# Patient Record
Sex: Male | Born: 1960 | Race: White | Hispanic: No | Marital: Married | Smoking: Current every day smoker
Health system: Southern US, Community
[De-identification: ages and names within clinical notes are randomized; demographics above are authoritative.]

## PROBLEM LIST (undated history)

## (undated) DIAGNOSIS — K529 Noninfective gastroenteritis and colitis, unspecified: Secondary | ICD-10-CM

## (undated) DIAGNOSIS — I10 Essential (primary) hypertension: Secondary | ICD-10-CM

## (undated) DIAGNOSIS — M1990 Unspecified osteoarthritis, unspecified site: Secondary | ICD-10-CM

## (undated) DIAGNOSIS — K219 Gastro-esophageal reflux disease without esophagitis: Secondary | ICD-10-CM

## (undated) HISTORY — PX: SURGERY SCROTAL / TESTICULAR: SUR1316

## (undated) HISTORY — PX: KNEE SURGERY: SHX244

---

## 2001-03-20 ENCOUNTER — Encounter: Payer: Self-pay | Admitting: Emergency Medicine

## 2001-03-20 ENCOUNTER — Emergency Department (HOSPITAL_COMMUNITY): Admission: EM | Admit: 2001-03-20 | Discharge: 2001-03-20 | Payer: Self-pay | Admitting: Emergency Medicine

## 2002-07-14 ENCOUNTER — Emergency Department (HOSPITAL_COMMUNITY): Admission: EM | Admit: 2002-07-14 | Discharge: 2002-07-14 | Payer: Self-pay | Admitting: Emergency Medicine

## 2002-07-14 ENCOUNTER — Encounter: Payer: Self-pay | Admitting: Emergency Medicine

## 2002-08-30 ENCOUNTER — Emergency Department (HOSPITAL_COMMUNITY): Admission: EM | Admit: 2002-08-30 | Discharge: 2002-08-30 | Payer: Self-pay | Admitting: Emergency Medicine

## 2004-03-24 ENCOUNTER — Ambulatory Visit (HOSPITAL_COMMUNITY): Admission: RE | Admit: 2004-03-24 | Discharge: 2004-03-24 | Payer: Self-pay | Admitting: General Surgery

## 2004-05-08 ENCOUNTER — Emergency Department (HOSPITAL_COMMUNITY): Admission: EM | Admit: 2004-05-08 | Discharge: 2004-05-08 | Payer: Self-pay | Admitting: Emergency Medicine

## 2004-06-12 ENCOUNTER — Emergency Department (HOSPITAL_COMMUNITY): Admission: EM | Admit: 2004-06-12 | Discharge: 2004-06-12 | Payer: Self-pay | Admitting: Emergency Medicine

## 2004-06-14 ENCOUNTER — Ambulatory Visit: Payer: Self-pay | Admitting: Urgent Care

## 2004-06-28 ENCOUNTER — Ambulatory Visit (HOSPITAL_COMMUNITY): Admission: RE | Admit: 2004-06-28 | Discharge: 2004-06-28 | Payer: Self-pay | Admitting: Internal Medicine

## 2004-06-28 ENCOUNTER — Ambulatory Visit: Payer: Self-pay | Admitting: Internal Medicine

## 2004-07-22 ENCOUNTER — Inpatient Hospital Stay (HOSPITAL_COMMUNITY): Admission: EM | Admit: 2004-07-22 | Discharge: 2004-07-25 | Payer: Self-pay | Admitting: Emergency Medicine

## 2004-07-22 ENCOUNTER — Ambulatory Visit: Payer: Self-pay | Admitting: Internal Medicine

## 2004-08-10 ENCOUNTER — Ambulatory Visit: Payer: Self-pay | Admitting: Internal Medicine

## 2004-08-18 ENCOUNTER — Emergency Department (HOSPITAL_COMMUNITY): Admission: EM | Admit: 2004-08-18 | Discharge: 2004-08-18 | Payer: Self-pay | Admitting: Emergency Medicine

## 2005-02-17 ENCOUNTER — Emergency Department (HOSPITAL_COMMUNITY): Admission: EM | Admit: 2005-02-17 | Discharge: 2005-02-17 | Payer: Self-pay | Admitting: *Deleted

## 2005-03-05 ENCOUNTER — Emergency Department (HOSPITAL_COMMUNITY): Admission: EM | Admit: 2005-03-05 | Discharge: 2005-03-05 | Payer: Self-pay | Admitting: Emergency Medicine

## 2005-03-19 ENCOUNTER — Emergency Department (HOSPITAL_COMMUNITY): Admission: EM | Admit: 2005-03-19 | Discharge: 2005-03-19 | Payer: Self-pay | Admitting: *Deleted

## 2005-05-03 ENCOUNTER — Ambulatory Visit: Payer: Self-pay | Admitting: Internal Medicine

## 2005-05-12 ENCOUNTER — Emergency Department (HOSPITAL_COMMUNITY): Admission: EM | Admit: 2005-05-12 | Discharge: 2005-05-12 | Payer: Self-pay | Admitting: Emergency Medicine

## 2005-08-31 ENCOUNTER — Ambulatory Visit (HOSPITAL_COMMUNITY): Admission: RE | Admit: 2005-08-31 | Discharge: 2005-08-31 | Payer: Self-pay | Admitting: General Surgery

## 2005-09-27 ENCOUNTER — Ambulatory Visit: Payer: Self-pay | Admitting: Orthopedic Surgery

## 2005-10-02 ENCOUNTER — Ambulatory Visit (HOSPITAL_COMMUNITY): Admission: RE | Admit: 2005-10-02 | Discharge: 2005-10-02 | Payer: Self-pay | Admitting: Orthopedic Surgery

## 2005-10-02 ENCOUNTER — Ambulatory Visit: Payer: Self-pay | Admitting: Orthopedic Surgery

## 2005-10-05 ENCOUNTER — Encounter (HOSPITAL_COMMUNITY): Admission: RE | Admit: 2005-10-05 | Discharge: 2005-11-04 | Payer: Self-pay | Admitting: Orthopedic Surgery

## 2005-10-08 ENCOUNTER — Ambulatory Visit: Payer: Self-pay | Admitting: Orthopedic Surgery

## 2005-10-31 ENCOUNTER — Ambulatory Visit: Payer: Self-pay | Admitting: Orthopedic Surgery

## 2005-11-28 ENCOUNTER — Ambulatory Visit: Payer: Self-pay | Admitting: Orthopedic Surgery

## 2006-01-14 ENCOUNTER — Ambulatory Visit: Payer: Self-pay | Admitting: Orthopedic Surgery

## 2006-02-13 ENCOUNTER — Ambulatory Visit: Payer: Self-pay | Admitting: Orthopedic Surgery

## 2006-03-08 ENCOUNTER — Ambulatory Visit (HOSPITAL_COMMUNITY): Admission: RE | Admit: 2006-03-08 | Discharge: 2006-03-08 | Payer: Self-pay | Admitting: Family Medicine

## 2006-03-08 ENCOUNTER — Ambulatory Visit (HOSPITAL_COMMUNITY): Admission: RE | Admit: 2006-03-08 | Discharge: 2006-03-08 | Payer: Self-pay | Admitting: Orthopedic Surgery

## 2006-08-01 ENCOUNTER — Ambulatory Visit: Payer: Self-pay | Admitting: Orthopedic Surgery

## 2006-08-29 ENCOUNTER — Ambulatory Visit: Payer: Self-pay | Admitting: Orthopedic Surgery

## 2006-10-17 ENCOUNTER — Ambulatory Visit (HOSPITAL_COMMUNITY): Admission: RE | Admit: 2006-10-17 | Discharge: 2006-10-17 | Payer: Self-pay | Admitting: Family Medicine

## 2006-10-31 ENCOUNTER — Ambulatory Visit (HOSPITAL_COMMUNITY): Admission: RE | Admit: 2006-10-31 | Discharge: 2006-10-31 | Payer: Self-pay | Admitting: Family Medicine

## 2007-01-09 ENCOUNTER — Emergency Department (HOSPITAL_COMMUNITY): Admission: EM | Admit: 2007-01-09 | Discharge: 2007-01-09 | Payer: Self-pay | Admitting: Emergency Medicine

## 2007-01-12 ENCOUNTER — Emergency Department (HOSPITAL_COMMUNITY): Admission: EM | Admit: 2007-01-12 | Discharge: 2007-01-12 | Payer: Self-pay | Admitting: Emergency Medicine

## 2007-01-16 ENCOUNTER — Emergency Department (HOSPITAL_COMMUNITY): Admission: EM | Admit: 2007-01-16 | Discharge: 2007-01-16 | Payer: Self-pay | Admitting: Emergency Medicine

## 2007-03-03 ENCOUNTER — Emergency Department (HOSPITAL_COMMUNITY): Admission: EM | Admit: 2007-03-03 | Discharge: 2007-03-03 | Payer: Self-pay | Admitting: Emergency Medicine

## 2007-07-04 ENCOUNTER — Emergency Department (HOSPITAL_COMMUNITY): Admission: EM | Admit: 2007-07-04 | Discharge: 2007-07-04 | Payer: Self-pay | Admitting: Emergency Medicine

## 2007-08-22 ENCOUNTER — Ambulatory Visit (HOSPITAL_COMMUNITY): Admission: RE | Admit: 2007-08-22 | Discharge: 2007-08-22 | Payer: Self-pay | Admitting: Family Medicine

## 2007-08-26 ENCOUNTER — Encounter (HOSPITAL_COMMUNITY): Admission: RE | Admit: 2007-08-26 | Discharge: 2007-09-25 | Payer: Self-pay | Admitting: Family Medicine

## 2007-12-14 ENCOUNTER — Emergency Department (HOSPITAL_COMMUNITY): Admission: EM | Admit: 2007-12-14 | Discharge: 2007-12-14 | Payer: Self-pay | Admitting: Emergency Medicine

## 2008-01-01 IMAGING — CT CT CHEST W/ CM
2 of 3 series · 15 of 36 positions shown, 18 images · IV contrast (Omnipaque 300)
Comparison: Chest CTA 03/19/05.

CLINICAL DATA: Dyspnea and cough.  Chronic pleural effusion.  Weight loss.
CHEST CT WITH CONTRAST ? 10/31/06:
TECHNIQUE: Multidetector CT imaging of the chest was performed following the standard protocol during bolus administration of intravenous contrast.   
Contrast:  80 cc Omnipaque 300 IV.

[Series 2: chestroutine 5.0 b40f · axial · 0.65mm/px · z∈[-372,-72]mm · 12 of 72 slices shown, 15 images]
[im 6/72  mediastinal]
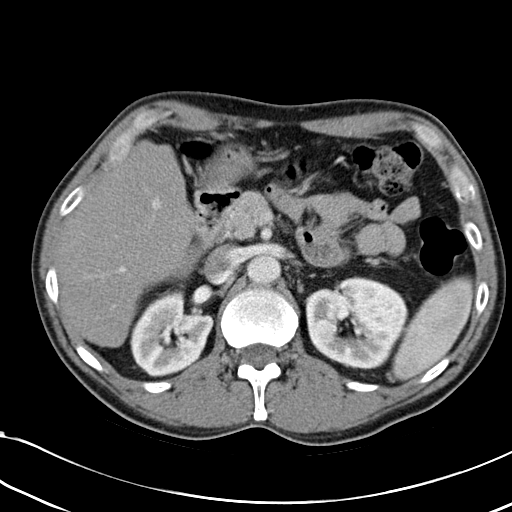
[im 6/72  lung]
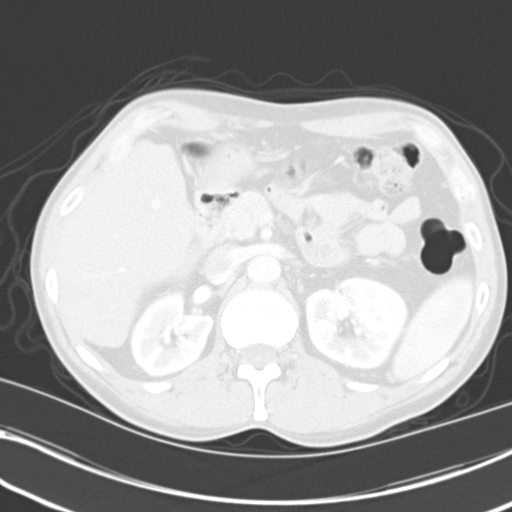
[im 11/72  lung]
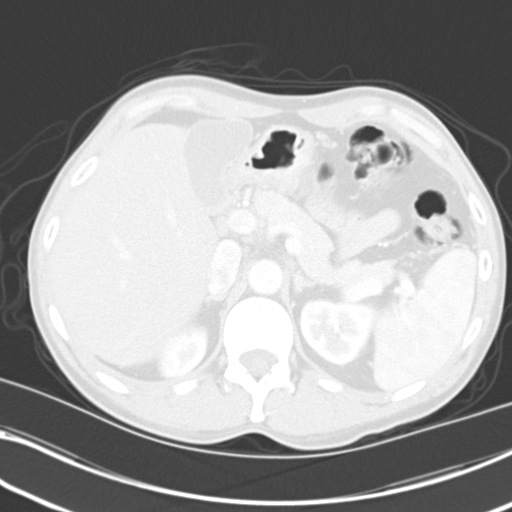
[im 16/72  lung]
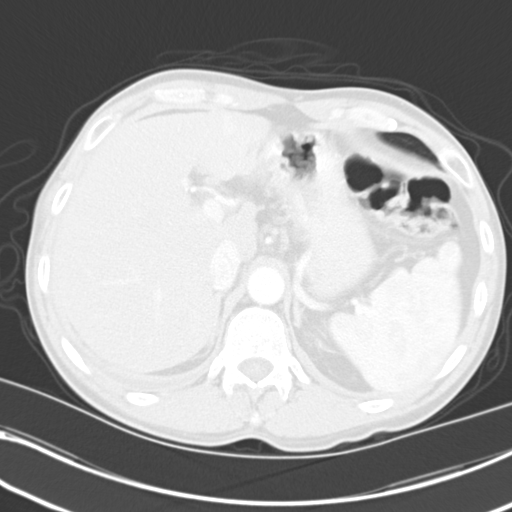
[im 22/72  lung]
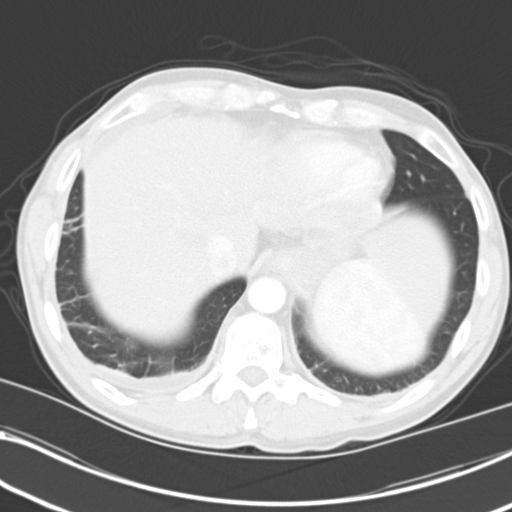
[im 27/72  mediastinal]
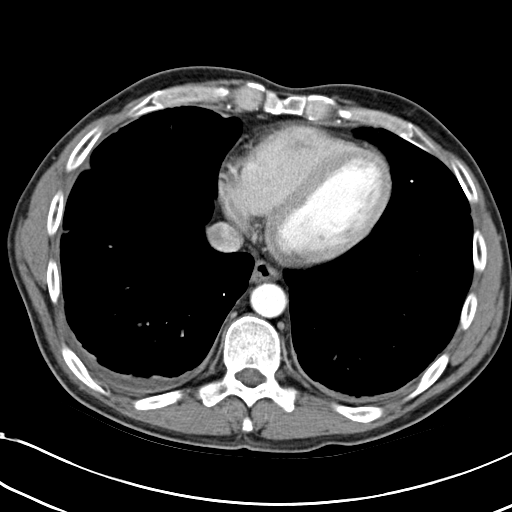
[im 27/72  lung]
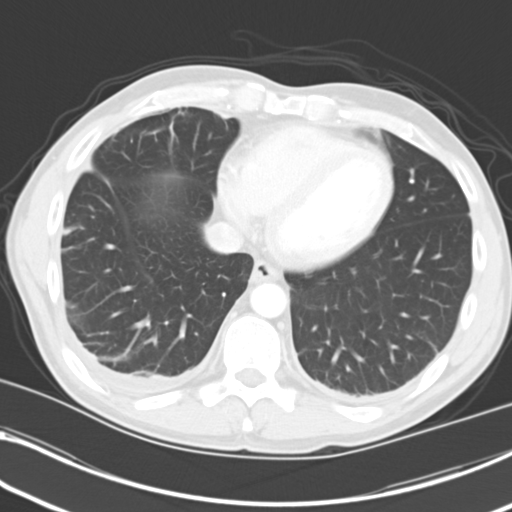
[im 32/72  lung]
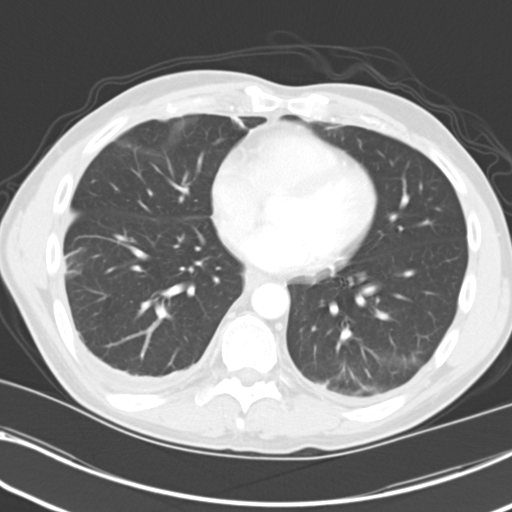
[im 40/72  lung]
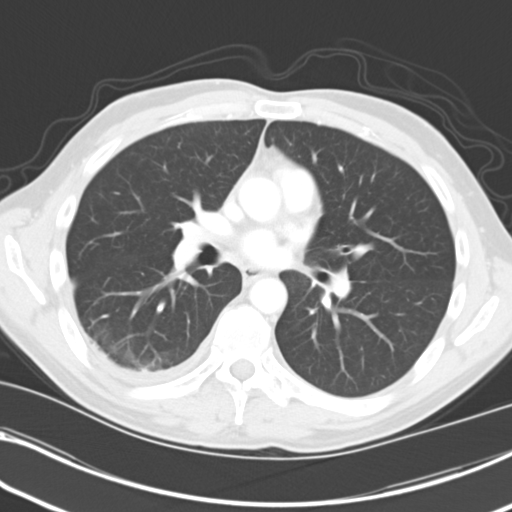
[im 45/72  lung]
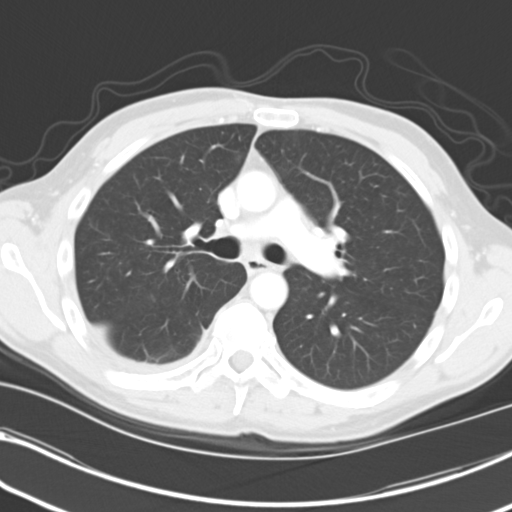
[im 50/72  mediastinal]
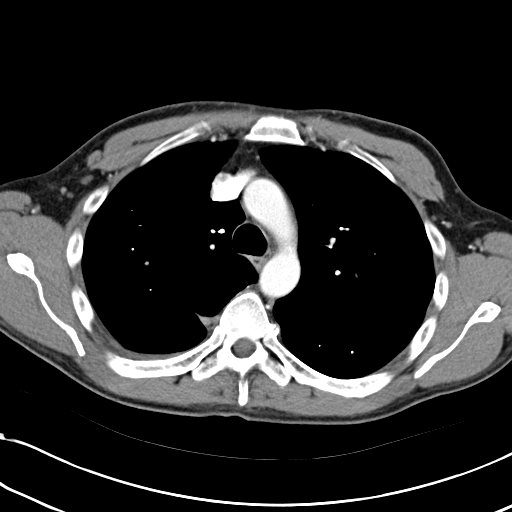
[im 50/72  lung]
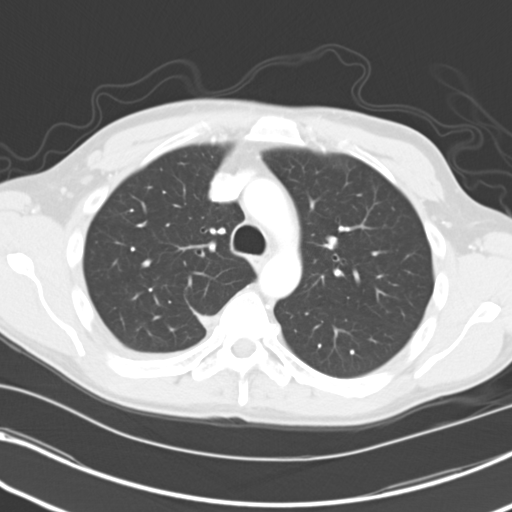
[im 56/72  lung]
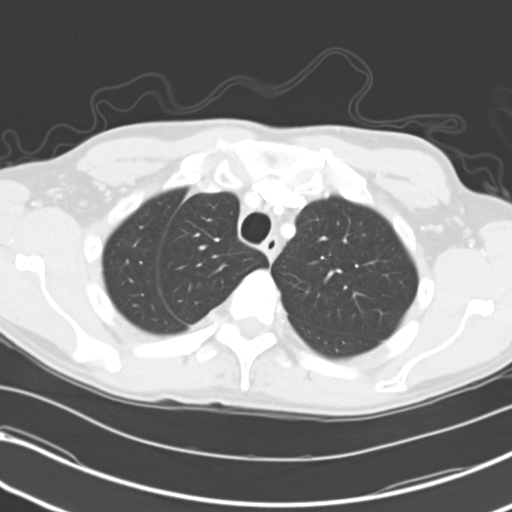
[im 61/72  lung]
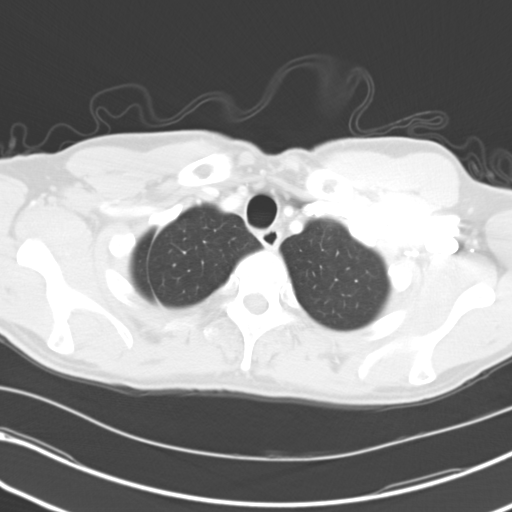
[im 66/72  lung]
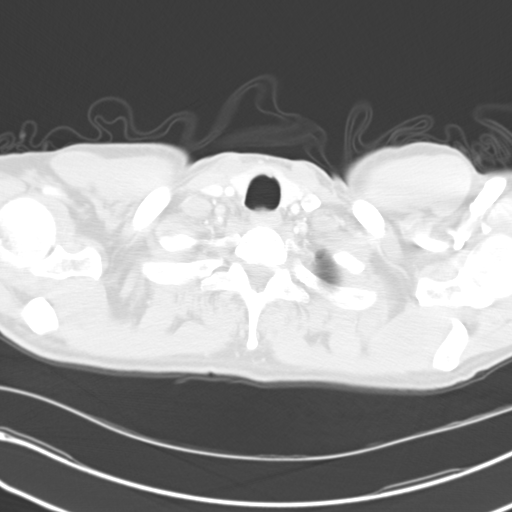

[Series 4: mpr coronal chest · coronal · 0.61mm/px · 3 of 77 slices shown]
[im 16/77  lung]
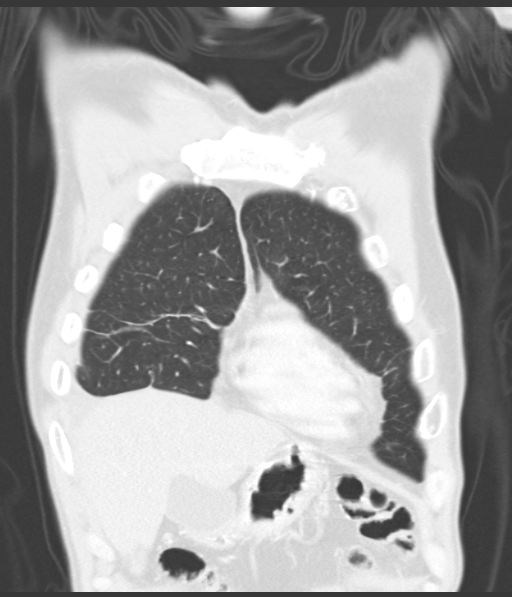
[im 31/77  lung]
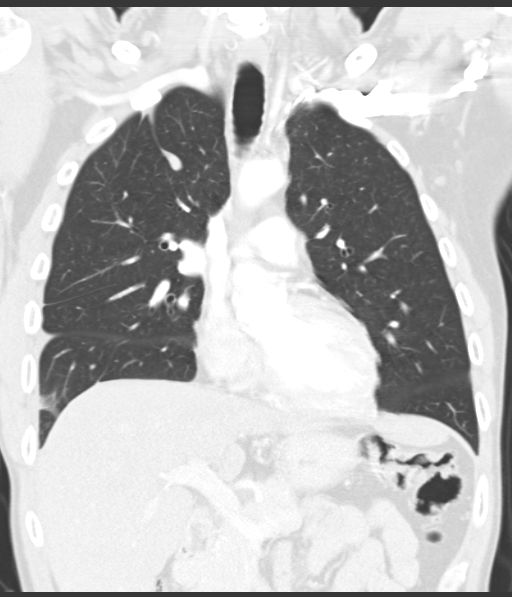
[im 46/77  lung]
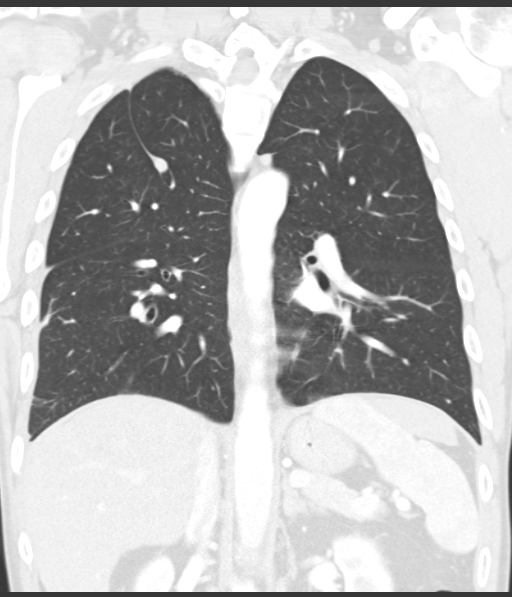

[15 of 36 positions shown; findings below may reference images not displayed]

FINDINGS: The current examination was performed as a routine study, not as a CTA.  The aorta, great vessels, and pulmonary arteries appear unremarkable.  There are no enlarged mediastinal or hilar lymph nodes.  A small hiatal hernia is noted. There is no pericardial effusion.
A small right-sided pleural effusion is associated with enhancement of the pleura. There is minimal pleural fluid on the left.  Compared with the prior examination, the volume of the pleural effusion has decreased.  The basilar aeration has improved with mild residual atelectasis in both lower lobes and in the right middle lobe.  The patient has an azygos fissure.  No confluent air space opacity or pulmonary nodule is demonstrated.  
No suspicious findings are seen in the upper abdomen.  Small transient hepatic attenuation differences in the right and left hepatic lobes appear stable compared with the prior examination.
IMPRESSION: 1.  Interval improved pleural effusions with near complete resolution on the left.  There is some pleural thickening on the right, but no evidence of a focal pleural based mass.
2.  Interval improved aeration of the lung bases with scattered areas of atelectasis.  No confluent air space opacity or mediastinal adenopathy.
3.  No suspicious findings in the upper abdomen.
4.  There does not appear to be significant residual pleural fluid to allow safe thoracentesis.  CT follow-up in 3-6 months should be considered to evaluate evolution of the pleural findings.

## 2010-04-05 ENCOUNTER — Emergency Department (HOSPITAL_COMMUNITY): Admission: EM | Admit: 2010-04-05 | Discharge: 2010-04-05 | Payer: Self-pay | Admitting: Emergency Medicine

## 2010-06-17 ENCOUNTER — Emergency Department (HOSPITAL_COMMUNITY): Admission: EM | Admit: 2010-06-17 | Discharge: 2010-06-18 | Payer: Self-pay | Admitting: Emergency Medicine

## 2010-08-13 ENCOUNTER — Encounter: Payer: Self-pay | Admitting: Orthopedic Surgery

## 2010-10-03 LAB — BASIC METABOLIC PANEL
BUN: 9 mg/dL (ref 6–23)
CO2: 29 mEq/L (ref 19–32)
Calcium: 9.6 mg/dL (ref 8.4–10.5)
Chloride: 102 mEq/L (ref 96–112)
Creatinine, Ser: 0.69 mg/dL (ref 0.4–1.5)
GFR calc Af Amer: >60 mL/min (ref >60)
GFR calc non Af Amer: >60 mL/min (ref >60)
Glucose, Bld: 93 mg/dL (ref 70–99)
Potassium: 3.4 mEq/L — ABNORMAL LOW (ref 3.5–5.1)
Sodium: 139 mEq/L (ref 135–145)

## 2010-10-03 LAB — POCT CARDIAC MARKERS
CKMB, poc: <1 ng/mL — ABNORMAL LOW (ref 1.0–8.0)
CKMB, poc: <1 ng/mL — ABNORMAL LOW (ref 1.0–8.0)
Myoglobin, poc: 32.4 ng/mL (ref 12–200)
Myoglobin, poc: 38 ng/mL (ref 12–200)
Troponin i, poc: <0.05 ng/mL (ref 0.00–0.09)

## 2010-10-03 LAB — DIFFERENTIAL
Basophils Absolute: 0 10*3/uL (ref 0.0–0.1)
Basophils Relative: 0 % (ref 0–1)
Eosinophils Absolute: 0.2 10*3/uL (ref 0.0–0.7)
Eosinophils Relative: 2 % (ref 0–5)
Lymphocytes Relative: 39 % (ref 12–46)
Lymphs Abs: 3 10*3/uL (ref 0.7–4.0)
Monocytes Absolute: 0.6 10*3/uL (ref 0.1–1.0)
Monocytes Relative: 8 % (ref 3–12)
Neutro Abs: 3.9 10*3/uL (ref 1.7–7.7)
Neutrophils Relative %: 52 % (ref 43–77)

## 2010-10-03 LAB — CBC
HCT: 43 % (ref 39.0–52.0)
Hemoglobin: 15.4 g/dL (ref 13.0–17.0)
MCH: 34.1 pg — ABNORMAL HIGH (ref 26.0–34.0)
MCHC: 35.8 g/dL (ref 30.0–36.0)
MCV: 95.3 fL (ref 78.0–100.0)
Platelets: 184 10*3/uL (ref 150–400)
RBC: 4.51 MIL/uL (ref 4.22–5.81)
RDW: 13.1 % (ref 11.5–15.5)
WBC: 7.6 10*3/uL (ref 4.0–10.5)

## 2010-12-08 NOTE — Discharge Summary (Signed)
Brett [name], Brett [name]                ACCOUNT NO.:  [id number]   MEDICAL RECORD NO.:  [id number]          PATIENT TYPE:  INP   LOCATION:  A338                          FACILITY:  [facility]   PHYSICIAN:  Vania Rea, M.D. DATE OF BIRTH:  [birth year]   DATE OF ADMISSION:  07/22/2004  DATE OF DISCHARGE:  01/03/2006LH                                 DISCHARGE SUMMARY   PRIMARY CARE PHYSICIAN:  Dr. Elpidio Anis.   CONSULTS THIS ADMISSION:  Drs. Rehman and Rourk, gastroenterologists.   DISCHARGE DIAGNOSES:  1.  Segmental colitis, probably ischemic.  2.  Hematochezia secondary to the above, resolved.  3.  History of liver cirrhosis.  4.  History of chronic hepatitis B.  5.  History of insomnia.  6.  Ongoing alcohol abuse.  7.  Ongoing tobacco abuse.  8.  Parasplenic pseudomass, resolved.   DISPOSITION:  Discharged to home.   DISCHARGE CONDITION:  Stable.   DISCHARGE MEDICATIONS:  1.  Rowasa enemas 1 each x 4 more nights.  2.  Aciphex 20 mg daily.  3.  Continue outpatient medications which included Ativan and Seroquel.  4.  Vicodin 5/500 qid prn; 8 tablets prescribed.   HOSPITAL COURSE:  Please refer to admission history and physical of December  31.  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  The patient  was admitted for management.  The patient had a total colonoscopy done,  yesterday. January 2, which revealed segmental colitis, probably ischemic in  nature.  The patient had stool work-up which was negative for WBC or any  evidence of C. difficile .   The patient's initial CT scan revealed well-circumcised mass adjacent to the  spleen, thought initially by the radiologist to be either hematoma or a  tumor.  At the same time, he had  periaortic lymph nodes which were mildly  enlarged.  A work-up to elucidate the precise cause of the mass was  initiated; however, further review via the radiologist this morning revealed  that the most likely cause of this mass was an extra lobe of the liver.  A  subsequent MRI confirmed this diagnosis.   Today, the patient is alert and oriented.  He is eating a full diet without  difficulty.  He is in no distress.   PHYSICAL EXAMINATION:  VITAL SIGNS:  Temperature 98, pulse 63, respiration  20, blood pressure 116, 79.  CHEST:  Clear to auscultation bilaterally.  CARDIOVASCULAR:  Regular rhythm.  ABDOMEN:  Soft.  He has mild epigastric tenderness.  EXTREMITIES:  Without edema.   LABORATORY DATA:  CBC is significant for white count of 4.4, hemoglobin  14.1, MCV 102, platelets 118.  Serum chemistry is completely normal.   FOLLOW UP:  1.  The patient is to follow up with his primary care physician, Dr. Jerolyn Shin  Smith.  2.  To follow up with Dr. Lionel December, in regard to his cirrhosis and      hepatitis.   SPECIAL INSTRUCTIONS:  The patient is to use his medication as ordered.  He  is to seek out help with Alcoholics Anonymous or other group assistance with  alcohol cessation, and he is to reduce or completely discontinued his  tobacco use.  The relationship between his tobacco use and ischemic colitis  has been explained the patient, and he has verbalized full understanding.     Leop   LC/MEDQ  D:  07/25/2004  T:  07/25/2004  Job:  [id number]
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
## 2010-12-08 NOTE — Consult Note (Signed)
Brett [name], Brett [name]                ACCOUNT NO.:  [id number]   MEDICAL RECORD NO.:  [id number]          PATIENT TYPE:  EMS   LOCATION:  ED                            FACILITY:  [facility]   PHYSICIAN:  Dirk Dress. Katrinka Blazing, M.D.   DATE OF BIRTH:  [birth year]   DATE OF CONSULTATION:  DATE OF DISCHARGE:  08/18/2004                                   CONSULTATION   This is a 50 year old male who was recently hospitalized with an acute  infection and hypokalemia.  He was treated with multiple runs of IV  potassium.  Since getting home, he has noted swelling in the dorsum of his  left hand and has noted a hard vessel in the dorsum of the left hand.  He  has been treated but feels that the area is not resolving as quick as he  suspected.  The patient is being seen to try to allay his fears and to try  to make sure that there is no infectious phlebitis present.   PHYSICAL EXAMINATION:  MUSCULOSKELETAL:  The patient has a very superficial  vein on the dorsum of his hand.  The length of the vein is about 2.5-cm.  There is no major surrounding inflammation.  There is no induration.  There  is no phlebitis extending above the wrist and there is no phlebitis or  swelling in the remainder of the arm.  He has good mobility of the hand.  There is no other soft tissue involvement.   The patient is reassured that this is a routine process that occurs with IV  therapy and that this should resolve over a period of 2-3 weeks.  He seems  to be acceptable of this.  No other therapy is indicated.  The patient is  discharged home, and he is advised to use warm compresses on his hand for  comfort.  He is told to stop taking the Keflex that he was given since this  might make him more susceptible to developing a resistant infection of some  kind elsewhere in his body.  He has decided that he will follow through.  He  will be seen in the office on a routine visit in 1-2 weeks.      LCS/MEDQ  D:  08/18/2004  T:   08/19/2004  Job:  [id number]   cc:   Lionel December, M.D.  [address]  [place]  [place] [address]

## 2010-12-08 NOTE — Procedures (Signed)
Brett [name], Brett [name]                ACCOUNT NO.:  [id number]   MEDICAL RECORD NO.:  [id number]          PATIENT TYPE:  EMS   LOCATION:  ED                            FACILITY:  [facility]   PHYSICIAN:  Edward L. Juanetta Gosling, M.D.DATE OF BIRTH:  Apr 25, 1961   DATE OF PROCEDURE:  03/19/2005  DATE OF DISCHARGE:                                EKG INTERPRETATION   TIME OF STUDY:  0512 hours on March 19, 2005.   RESULTS:  The rhythm is sinus tachycardia.  The rate about 107.  T-wave  abnormalities are seen anteriorly and to a lesser extent inferiorly which  were nonspecific.   IMPRESSION:  Abnormal electrocardiogram.      Edward L. Juanetta Gosling, M.D.  Electronically Signed     ELH/MEDQ  D:  03/19/2005  T:  03/19/2005  Job:  [id number]

## 2010-12-08 NOTE — Group Therapy Note (Signed)
Brett [name], Brett [name]                ACCOUNT NO.:  [id number]   MEDICAL RECORD NO.:  [id number]          PATIENT TYPE:  INP   LOCATION:  A225                          FACILITY:  [facility]   PHYSICIAN:  Vania Rea, M.D. DATE OF BIRTH:  03-14-1961   DATE OF PROCEDURE:  DATE OF DISCHARGE:                                   PROGRESS NOTE   HOSPITAL DAY #3   SUBJECTIVE:  The patient is status post TCS this morning and says he feels  some mild abdominal discomfort which is relieved by passing gas.  Otherwise  not much in the way of pain.   OBJECTIVE:  VITAL SIGNS:  Temperature 97, pulse 65, respirations 18, blood  pressure 130/84.  He is not dehydrated.  CHEST:  Clear to auscultation bilaterally.  CARDIOVASCULAR:  Regular rhythm.  ABDOMEN:  Somewhat distended.  Left lower quadrant tenderness.   LABORATORY:  His CBC is unremarkable.  His platelet count is 118 which is  stable.  His serum chemistry shows a sodium of 140, potassium 3.8, chloride  105, CO2 29, glucose 89, BUN 2, creatinine 0.8, calcium 8.6.   His colonoscopy revealed segmental colitis but nothing at the proximal  rectum.  Biopsies were taken.   ASSESSMENT:  1.  Hematochezia related to segmental colitis.  Management with Rowasa      enemas as per GI.  Continue Cipro and Flagyl.  2.  Abdominal pain related to colitis.  3.  Left perisplenic mass.  Discussed with radiology probably hematoma,      since the mass is nonenhancing.  However, tumor is not ruled out.      Awaiting ultrasound of the abdomen.  4.  Tobacco abuse.  Discussed with the patient who is trying to cut down.      He is aware of the detrimental effects of his smoking on his colitis.  5.  A history of liver cirrhosis with hepatitis B infection, to be followed      up as an outpatient.     Leop   LC/MEDQ  D:  07/24/2004  T:  07/24/2004  Job:  [id number]
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
## 2010-12-19 ENCOUNTER — Emergency Department (HOSPITAL_COMMUNITY)
Admission: EM | Admit: 2010-12-19 | Discharge: 2010-12-19 | Disposition: A | Discharge status: Home / Self Care | Payer: Self-pay | Attending: Emergency Medicine | Admitting: Emergency Medicine

## 2010-12-19 ENCOUNTER — Emergency Department (HOSPITAL_COMMUNITY): Payer: Self-pay

## 2010-12-19 DIAGNOSIS — F17200 Nicotine dependence, unspecified, uncomplicated: Secondary | ICD-10-CM | POA: Insufficient documentation

## 2010-12-19 DIAGNOSIS — Y998 Other external cause status: Secondary | ICD-10-CM | POA: Insufficient documentation

## 2010-12-19 DIAGNOSIS — M25519 Pain in unspecified shoulder: Secondary | ICD-10-CM | POA: Insufficient documentation

## 2010-12-19 DIAGNOSIS — S139XXA Sprain of joints and ligaments of unspecified parts of neck, initial encounter: Secondary | ICD-10-CM | POA: Insufficient documentation

## 2010-12-19 DIAGNOSIS — W010XXA Fall on same level from slipping, tripping and stumbling without subsequent striking against object, initial encounter: Secondary | ICD-10-CM | POA: Insufficient documentation

## 2011-05-07 LAB — RAPID URINE DRUG SCREEN, HOSP PERFORMED
Opiates: NOT DETECTED
Tetrahydrocannabinol: NOT DETECTED

## 2011-05-07 LAB — BASIC METABOLIC PANEL
BUN: 5 — ABNORMAL LOW
CO2: 28
Calcium: 8.7
Chloride: 103
Creatinine, Ser: 0.65
GFR calc Af Amer: >60
Glucose, Bld: 104 — ABNORMAL HIGH

## 2011-05-07 LAB — DIFFERENTIAL
Basophils Absolute: 0.2 — ABNORMAL HIGH
Basophils Relative: 2 — ABNORMAL HIGH
Eosinophils Absolute: 0.1
Monocytes Relative: 5
Neutrophils Relative %: 52

## 2011-05-07 LAB — CBC
MCHC: 34.1
MCV: 97.7
RBC: 4.45
RDW: 12.5

## 2011-05-09 LAB — BASIC METABOLIC PANEL
BUN: 4 — ABNORMAL LOW
BUN: 6
CO2: 28
Chloride: 103
Chloride: 106
Creatinine, Ser: 0.61
Creatinine, Ser: 0.7
GFR calc non Af Amer: >60

## 2011-05-09 LAB — CBC
MCHC: 35.3
MCV: 100.1 — ABNORMAL HIGH
MCV: 98.7
Platelets: 190
Platelets: 205
RDW: 13.6
WBC: 8.3

## 2011-05-09 LAB — DIFFERENTIAL
Basophils Absolute: 0
Basophils Relative: 1
Eosinophils Absolute: 0.1
Eosinophils Absolute: 0.1
Lymphs Abs: 4.4 — ABNORMAL HIGH
Neutrophils Relative %: 39 — ABNORMAL LOW
Neutrophils Relative %: 47

## 2011-05-09 LAB — RAPID URINE DRUG SCREEN, HOSP PERFORMED
Amphetamines: NOT DETECTED
Barbiturates: NOT DETECTED
Barbiturates: NOT DETECTED
Benzodiazepines: POSITIVE — AB
Benzodiazepines: POSITIVE — AB
Cocaine: NOT DETECTED

## 2011-05-09 LAB — ETHANOL
Alcohol, Ethyl (B): 291 — ABNORMAL HIGH
Alcohol, Ethyl (B): 328 — ABNORMAL HIGH

## 2011-05-09 LAB — ACETAMINOPHEN LEVEL: Acetaminophen (Tylenol), Serum: <10 — ABNORMAL LOW

## 2011-05-09 LAB — SALICYLATE LEVEL: Salicylate Lvl: <4

## 2011-05-26 ENCOUNTER — Emergency Department (HOSPITAL_COMMUNITY): Payer: Self-pay

## 2011-05-26 ENCOUNTER — Emergency Department (HOSPITAL_COMMUNITY)
Admission: EM | Admit: 2011-05-26 | Discharge: 2011-05-26 | Disposition: A | Discharge status: Home / Self Care | Payer: Self-pay | Attending: Emergency Medicine | Admitting: Emergency Medicine

## 2011-05-26 DIAGNOSIS — F17200 Nicotine dependence, unspecified, uncomplicated: Secondary | ICD-10-CM | POA: Insufficient documentation

## 2011-05-26 DIAGNOSIS — X500XXA Overexertion from strenuous movement or load, initial encounter: Secondary | ICD-10-CM | POA: Insufficient documentation

## 2011-05-26 DIAGNOSIS — S60219A Contusion of unspecified wrist, initial encounter: Secondary | ICD-10-CM | POA: Insufficient documentation

## 2011-05-26 MED ORDER — HYDROCODONE-ACETAMINOPHEN 5-325 MG PO TABS: 1.0000 | ORAL_TABLET | ORAL | Status: AC | PRN

## 2011-05-26 MED ORDER — HYDROCODONE-ACETAMINOPHEN 5-325 MG PO TABS
2.0000 | ORAL_TABLET | Freq: Once | ORAL | Status: AC
  Administered 2011-05-26: 2 via ORAL
  Filled 2011-05-26: qty 2

## 2011-05-26 MED ORDER — IBUPROFEN 800 MG PO TABS: 800.0000 mg | ORAL_TABLET | Freq: Three times a day (TID) | ORAL | Status: AC

## 2011-05-26 MED ORDER — IBUPROFEN 800 MG PO TABS
800.0000 mg | ORAL_TABLET | Freq: Once | ORAL | Status: AC
  Administered 2011-05-26: 800 mg via ORAL
  Filled 2011-05-26: qty 1

## 2011-05-26 NOTE — ED Provider Notes (Signed)
History     CSN: [id number] Arrival date & time: 05/26/2011  3:24 PM   First MD Initiated Contact with Patient 05/26/11 1555      Chief Complaint  Patient presents with  . Hand Injury    (Consider location/radiation/quality/duration/timing/severity/associated sxs/prior treatment) Patient is a 50 y.o. male presenting with hand injury. The history is provided by the patient.  Hand Injury  The incident occurred yesterday. Injury mechanism: Tree limb snapped upward and hit left hand/wrist. The pain is present in the left hand and left wrist. The quality of the pain is described as aching. The pain is moderate. Pertinent negatives include no fever and no malaise/fatigue. The symptoms are aggravated by movement and use. He has tried nothing for the symptoms.    History reviewed. No pertinent past medical history.  Past Surgical History  Procedure Date  . Surgery scrotal / testicular     History reviewed. No pertinent family history.  History  Substance Use Topics  . Smoking status: Current Everyday Smoker -- 1.0 packs/day  . Smokeless tobacco: Not on file  . Alcohol Use: 3.0 oz/week    5 Cans of beer per week     every weekend.       Review of Systems  Constitutional: Negative for fever, malaise/fatigue and activity change.       All ROS Neg except as noted in HPI  HENT: Negative for nosebleeds and neck pain.   Eyes: Negative for photophobia and discharge.  Respiratory: Negative for cough, shortness of breath and wheezing.   Cardiovascular: Negative for chest pain and palpitations.  Gastrointestinal: Negative for abdominal pain and blood in stool.  Genitourinary: Negative for dysuria, frequency and hematuria.  Musculoskeletal: Negative for back pain and arthralgias.  Skin: Negative.   Neurological: Negative for dizziness, seizures and speech difficulty.  Psychiatric/Behavioral: Negative for hallucinations and confusion.    Allergies  Review of patient's allergies  indicates no known allergies.  Home Medications  No current outpatient prescriptions on file.  BP 134/86  Pulse 88  Temp(Src) 98.2 F (36.8 C) (Oral)  Resp 18  Ht 5\' 10"  (1.778 m)  Wt 165 lb (74.844 kg)  BMI 23.68 kg/m2  SpO2 98%  Physical Exam  Nursing note and vitals reviewed. Constitutional: He is oriented to person, place, and time. He appears well-developed and well-nourished.  Non-toxic appearance.  HENT:  Head: Normocephalic.  Right Ear: Tympanic membrane and external ear normal.  Left Ear: Tympanic membrane and external ear normal.  Eyes: EOM and lids are normal. Pupils are equal, round, and reactive to light.  Neck: Normal range of motion. Neck supple. Carotid bruit is not present.  Cardiovascular: Normal rate, regular rhythm, normal heart sounds, intact distal pulses and normal pulses.   Pulmonary/Chest: Breath sounds normal. No respiratory distress.  Abdominal: Soft. Bowel sounds are normal. There is no tenderness. There is no guarding.  Musculoskeletal: Normal range of motion.       Pain at the navicular area of the left wrist. No significant swelling. Good cap refill on the left.  Lymphadenopathy:       Head (right side): No submandibular adenopathy present.       Head (left side): No submandibular adenopathy present.    He has no cervical adenopathy.  Neurological: He is alert and oriented to person, place, and time. He has normal strength. No cranial nerve deficit or sensory deficit.  Skin: Skin is warm and dry.  Psychiatric: He has a normal mood and affect. His  speech is normal.    ED Course  Procedures (including critical care time)  Labs Reviewed - No data to display Dg Hand Complete Left  05/26/2011  *RADIOLOGY REPORT*  Clinical Data: Hand injury.  LEFT HAND - COMPLETE 3+ VIEW  Comparison: None.  Findings: No acute bony abnormality.  Specifically, no fracture, subluxation, or dislocation.  Soft tissues are intact.  IMPRESSION: No acute bony abnormality.   Original Report Authenticated By: Cyndie Chime, M.D.     Dx: Contusion left wrist.   MDM  Test result given to the patient . The need to monitor the navicular area for changes discussed with patient. Splint applied. It is safe for pt to be d/c home.        Kathie Dike, [place] 05/26/11 1710

## 2011-05-26 NOTE — ED Notes (Signed)
Pt states first insult to wrist was yesterday when a tree limb fell on left thumb/hand area. Today pain was irritated by moving and installing new dryer.  No edema or deformity noted.
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
## 2011-05-26 NOTE — ED Provider Notes (Signed)
Medical screening examination/treatment/procedure(s) were performed by non-physician practitioner and as supervising physician I was immediately available for consultation/collaboration.   Dayton Bailiff, MD 05/26/11 1714

## 2013-03-02 ENCOUNTER — Emergency Department (HOSPITAL_COMMUNITY): Payer: Self-pay

## 2013-03-02 ENCOUNTER — Encounter (HOSPITAL_COMMUNITY): Payer: Self-pay | Admitting: *Deleted

## 2013-03-02 ENCOUNTER — Emergency Department (HOSPITAL_COMMUNITY)
Admission: EM | Admit: 2013-03-02 | Discharge: 2013-03-02 | Disposition: A | Discharge status: Home / Self Care | Payer: Self-pay | Attending: Emergency Medicine | Admitting: Emergency Medicine

## 2013-03-02 DIAGNOSIS — R197 Diarrhea, unspecified: Secondary | ICD-10-CM | POA: Insufficient documentation

## 2013-03-02 DIAGNOSIS — F17200 Nicotine dependence, unspecified, uncomplicated: Secondary | ICD-10-CM | POA: Insufficient documentation

## 2013-03-02 DIAGNOSIS — R05 Cough: Secondary | ICD-10-CM | POA: Insufficient documentation

## 2013-03-02 DIAGNOSIS — R634 Abnormal weight loss: Secondary | ICD-10-CM | POA: Insufficient documentation

## 2013-03-02 DIAGNOSIS — Z8719 Personal history of other diseases of the digestive system: Secondary | ICD-10-CM | POA: Insufficient documentation

## 2013-03-02 DIAGNOSIS — R112 Nausea with vomiting, unspecified: Secondary | ICD-10-CM | POA: Insufficient documentation

## 2013-03-02 DIAGNOSIS — R059 Cough, unspecified: Secondary | ICD-10-CM | POA: Insufficient documentation

## 2013-03-02 DIAGNOSIS — R109 Unspecified abdominal pain: Secondary | ICD-10-CM | POA: Insufficient documentation

## 2013-03-02 DIAGNOSIS — R630 Anorexia: Secondary | ICD-10-CM | POA: Insufficient documentation

## 2013-03-02 DIAGNOSIS — F1010 Alcohol abuse, uncomplicated: Secondary | ICD-10-CM | POA: Insufficient documentation

## 2013-03-02 DIAGNOSIS — J189 Pneumonia, unspecified organism: Secondary | ICD-10-CM

## 2013-03-02 HISTORY — DX: Noninfective gastroenteritis and colitis, unspecified: K52.9

## 2013-03-02 LAB — URINALYSIS, ROUTINE W REFLEX MICROSCOPIC
Bilirubin Urine: NEGATIVE
Ketones, ur: 40 mg/dL — AB
Nitrite: NEGATIVE
pH: 6.5 (ref 5.0–8.0)

## 2013-03-02 LAB — CBC WITH DIFFERENTIAL/PLATELET
Basophils Absolute: 0 10*3/uL (ref 0.0–0.1)
Basophils Relative: 0 % (ref 0–1)
Eosinophils Absolute: 0 10*3/uL (ref 0.0–0.7)
Eosinophils Relative: 0 % (ref 0–5)
HCT: 42.7 % (ref 39.0–52.0)
Hemoglobin: 15.3 g/dL (ref 13.0–17.0)
MCH: 36.3 pg — ABNORMAL HIGH (ref 26.0–34.0)
MCHC: 35.8 g/dL (ref 30.0–36.0)
Monocytes Absolute: 0.9 10*3/uL (ref 0.1–1.0)
Monocytes Relative: 10 % (ref 3–12)
RDW: 13 % (ref 11.5–15.5)

## 2013-03-02 LAB — BASIC METABOLIC PANEL
BUN: 7 mg/dL (ref 6–23)
Calcium: 9.7 mg/dL (ref 8.4–10.5)
Creatinine, Ser: 0.51 mg/dL (ref 0.50–1.35)
GFR calc Af Amer: >90 mL/min (ref >90)
GFR calc non Af Amer: >90 mL/min (ref >90)

## 2013-03-02 LAB — HEPATIC FUNCTION PANEL
AST: 181 U/L — ABNORMAL HIGH (ref 0–37)
Bilirubin, Direct: 0.5 mg/dL — ABNORMAL HIGH (ref 0.0–0.3)
Indirect Bilirubin: 0.9 mg/dL (ref 0.3–0.9)
Total Bilirubin: 1.4 mg/dL — ABNORMAL HIGH (ref 0.3–1.2)

## 2013-03-02 MED ORDER — MORPHINE SULFATE 4 MG/ML IJ SOLN
4.0000 mg | Freq: Once | INTRAMUSCULAR | Status: AC
  Administered 2013-03-02: 4 mg via INTRAVENOUS
  Filled 2013-03-02: qty 1

## 2013-03-02 MED ORDER — ONDANSETRON HCL 4 MG/2ML IJ SOLN
4.0000 mg | Freq: Once | INTRAMUSCULAR | Status: AC
  Administered 2013-03-02: 4 mg via INTRAVENOUS
  Filled 2013-03-02: qty 2

## 2013-03-02 MED ORDER — OXYCODONE-ACETAMINOPHEN 5-325 MG PO TABS: 2.0000 | ORAL_TABLET | ORAL | Status: DC | PRN

## 2013-03-02 MED ORDER — PROMETHAZINE HCL 25 MG PO TABS: 25.0000 mg | ORAL_TABLET | Freq: Four times a day (QID) | ORAL | Status: DC | PRN

## 2013-03-02 MED ORDER — IOHEXOL 300 MG/ML  SOLN
100.0000 mL | Freq: Once | INTRAMUSCULAR | Status: AC | PRN
  Administered 2013-03-02: 100 mL via INTRAVENOUS

## 2013-03-02 MED ORDER — IOHEXOL 300 MG/ML  SOLN
50.0000 mL | Freq: Once | INTRAMUSCULAR | Status: AC | PRN
  Administered 2013-03-02: 50 mL via ORAL

## 2013-03-02 MED ORDER — AZITHROMYCIN 250 MG PO TABS: ORAL_TABLET | ORAL | Status: DC

## 2013-03-02 NOTE — ED Notes (Signed)
C/o left sided abd pain x 6 wks with intermittent n/v/d.  Also reports weight loss since pain began.  Reports chest congestion, fever, and decreased appetite.

## 2013-03-02 NOTE — ED Provider Notes (Signed)
CSN: [id number]     Arrival date & time 03/02/13  1107 History  This chart was scribed for Brett Hutching, MD by Bennett Scrape, ED Scribe. This patient was seen in room APA19/APA19 and the patient's care was started at 11:45 AM.    Chief Complaint  Patient presents with  . Abdominal Pain    The history is provided by the patient. No language interpreter was used.   HPI Comments: Brett [name] is a 52 y.o. male who presents to the Emergency Department complaining of left lateral mid abdominal pain for the past 6 weeks. He describes the pain as a throbbing sensation and this morning he woke up with burning flank pain. He reports a 25 lb weight loss since the onset and has been experiencing associated intermittent nausea, NP cough, emesis, decreased appetite and diarrhea described as loose. He states that he works a full-time job, takes care of 3 properties, his parents and one child which causes him a lot of stress. He has waited until today to be seen due to the aforementioned stress. He denies hematochezia. He reports frequent alcohol abuse.   No PCP  Past Medical History  Diagnosis Date  . Colitis    Past Surgical History  Procedure Laterality Date  . Surgery scrotal / testicular     No family history on file. History  Substance Use Topics  . Smoking status: Current Every Day Smoker -- 1.00 packs/day    Types: Cigarettes  . Smokeless tobacco: Not on file  . Alcohol Use: 3.0 oz/week    5 Cans of beer per week     Comment: every weekend.     Review of Systems  A complete 10 system review of systems was obtained and all systems are negative except as noted in the HPI and PMH.   Allergies  Review of patient's allergies indicates no known allergies.  Home Medications  No current outpatient prescriptions on file.  Triage Vitals: BP 146/93  Pulse 120  Temp(Src) 98.1 F (36.7 C) (Oral)  Resp 20  Ht 5\' 9"  (1.753 m)  Wt 136 lb (61.689 kg)  BMI 20.07 kg/m2  SpO2  97%  Physical Exam  Nursing note and vitals reviewed. Constitutional: He is oriented to person, place, and time. He appears well-developed and well-nourished.  HENT:  Head: Normocephalic and atraumatic.  Eyes: Conjunctivae and EOM are normal. Pupils are equal, round, and reactive to light.  Neck: Normal range of motion. Neck supple.  Cardiovascular: Normal rate, regular rhythm and normal heart sounds.   Pulmonary/Chest: Effort normal and breath sounds normal.  Abdominal: Soft. Bowel sounds are normal.  Musculoskeletal: Normal range of motion.  Neurological: He is alert and oriented to person, place, and time.  Skin: Skin is warm and dry.  Psychiatric: He has a normal mood and affect.    ED Course   DIAGNOSTIC STUDIES: Oxygen Saturation is 97% on room air, normal by my interpretation.    COORDINATION OF CARE: 11:37 AM-Discussed treatment plan which includes  (CXR, CBC panel, CMP, UA) with pt at bedside and pt agreed to plan.   Procedures (including critical care time)  Labs Reviewed  CBC WITH DIFFERENTIAL - Abnormal; Notable for the following:    MCV 101.2 (*)    MCH 36.3 (*)    Platelets 105 (*)    All other components within normal limits  BASIC METABOLIC PANEL - Abnormal; Notable for the following:    Sodium 134 (*)    Chloride 92 (*)  All other components within normal limits  URINALYSIS, ROUTINE W REFLEX MICROSCOPIC - Abnormal; Notable for the following:    Hgb urine dipstick TRACE (*)    Ketones, ur 40 (*)    Protein, ur TRACE (*)    All other components within normal limits  HEPATIC FUNCTION PANEL - Abnormal; Notable for the following:    AST 181 (*)    ALT 112 (*)    Total Bilirubin 1.4 (*)    Bilirubin, Direct 0.5 (*)    All other components within normal limits  URINE MICROSCOPIC-ADD ON     Dg Chest 2 View  03/02/2013   *RADIOLOGY REPORT*  Clinical Data: Cough, abdominal pain  CHEST - 2 VIEW  Comparison: 06/17/2010  Findings: The heart size and  vascular pattern are normal.  There is an element of hyperinflation which suggests the possibility of COPD.  There is very mild scarring or subsegmental atelectasis in the lingula.  Mild patient rotation appears to account for subtle hazy density over the inferomedial right lower lobe, as this area is clear on the lateral view.  The lungs are otherwise clear.  IMPRESSION: Allowing for technical factors described above, no acute findings.   Original Report Authenticated By: Esperanza Heir, M.D.   Ct Abdomen Pelvis W Contrast  03/02/2013   *RADIOLOGY REPORT*  Clinical Data: left sided abdominal pain, weight loss  CT ABDOMEN AND PELVIS WITH CONTRAST  Technique:  Multidetector CT imaging of the abdomen and pelvis was performed following the standard protocol during bolus administration of intravenous contrast.  Contrast: 50mL OMNIPAQUE IOHEXOL 300 MG/ML  SOLN, OMNIPAQUE IOHEXOL 300 MG/ML  SOLN  Comparison: 07/22/04  Findings: Right lung base is clear except for minimal pleural thickening.  There is minimal pleural thickening at the left lung base as well, but in addition, there is mild focal infiltrate in the inferior left lower lobe over an area measuring about 4 x 3 cm.  There is severe fatty infiltration of the liver.  There is a satellite left lateral lobe of the liver anterior to the spleen, identical to prior study.  There are no focal hepatic abnormalities.  Gallbladder, spleen, pancreas, kidneys, and adrenal glands are normal.  No significant adenopathy present.  No ascites.  Calcification of the aorta with no dilatation.  Appendix is normal. There is mild wall thickening of the descending and proximal sigmoid colon, less prominent when compared to the prior study. Bladder is normal.  There are no acute musculoskeletal findings.  IMPRESSION:  1.  Severe stable fatty infiltration of the liver 2.  Mild wall thickening of the descending and sigmoid colon, less prominent when compared to the prior study.   Mild chronic colitis is not excluded.  Mild bilateral pleural thickening in the lung bases, a nonspecific finding.  4 x 3 cm focus of mild patchy infiltrate left lower lobe. Cannot exclude developing pneumonia or pneumonitis, although dependent atelectasis is also possible.   Original Report Authenticated By: Esperanza Heir, M.D.   No results found. No diagnosis found.  MDM  No acute abdomen. Discussed results of CT scan patient and his wife. Specifically we discussed his fatty liver, wall thickening in the descending and sigmoid colon, patchy infiltrate in the left lower lobe.  Patient will get primary care followup. Discharge medications Percocet, Phenergan 25 mg, Zithromax.  I personally performed the services described in this documentation, which was scribed in my presence. The recorded information has been reviewed and is accurate.    Arlys John  Adriana Simas, MD 03/02/13 1530

## 2013-03-02 NOTE — ED Notes (Signed)
Pt drinking contrast. 

## 2013-05-28 ENCOUNTER — Other Ambulatory Visit: Payer: Self-pay

## 2013-11-06 ENCOUNTER — Emergency Department (HOSPITAL_COMMUNITY): Payer: Self-pay

## 2013-11-06 ENCOUNTER — Emergency Department (HOSPITAL_COMMUNITY)
Admission: EM | Admit: 2013-11-06 | Discharge: 2013-11-06 | Disposition: A | Discharge status: Home / Self Care | Payer: Self-pay | Attending: Emergency Medicine | Admitting: Emergency Medicine

## 2013-11-06 ENCOUNTER — Encounter (HOSPITAL_COMMUNITY): Payer: Self-pay | Admitting: Emergency Medicine

## 2013-11-06 DIAGNOSIS — R1032 Left lower quadrant pain: Secondary | ICD-10-CM | POA: Insufficient documentation

## 2013-11-06 DIAGNOSIS — Z8719 Personal history of other diseases of the digestive system: Secondary | ICD-10-CM | POA: Insufficient documentation

## 2013-11-06 DIAGNOSIS — Z792 Long term (current) use of antibiotics: Secondary | ICD-10-CM | POA: Insufficient documentation

## 2013-11-06 DIAGNOSIS — R5381 Other malaise: Secondary | ICD-10-CM | POA: Insufficient documentation

## 2013-11-06 DIAGNOSIS — R197 Diarrhea, unspecified: Secondary | ICD-10-CM | POA: Insufficient documentation

## 2013-11-06 DIAGNOSIS — R1110 Vomiting, unspecified: Secondary | ICD-10-CM

## 2013-11-06 DIAGNOSIS — R112 Nausea with vomiting, unspecified: Secondary | ICD-10-CM | POA: Insufficient documentation

## 2013-11-06 DIAGNOSIS — W57XXXA Bitten or stung by nonvenomous insect and other nonvenomous arthropods, initial encounter: Secondary | ICD-10-CM | POA: Insufficient documentation

## 2013-11-06 DIAGNOSIS — Y939 Activity, unspecified: Secondary | ICD-10-CM | POA: Insufficient documentation

## 2013-11-06 DIAGNOSIS — S40269A Insect bite (nonvenomous) of unspecified shoulder, initial encounter: Secondary | ICD-10-CM | POA: Insufficient documentation

## 2013-11-06 DIAGNOSIS — Y929 Unspecified place or not applicable: Secondary | ICD-10-CM | POA: Insufficient documentation

## 2013-11-06 DIAGNOSIS — R5383 Other fatigue: Secondary | ICD-10-CM

## 2013-11-06 DIAGNOSIS — L819 Disorder of pigmentation, unspecified: Secondary | ICD-10-CM | POA: Insufficient documentation

## 2013-11-06 DIAGNOSIS — F17200 Nicotine dependence, unspecified, uncomplicated: Secondary | ICD-10-CM | POA: Insufficient documentation

## 2013-11-06 LAB — CBC WITH DIFFERENTIAL/PLATELET
BASOS PCT: 0 % (ref 0–1)
Basophils Absolute: 0 10*3/uL (ref 0.0–0.1)
EOS ABS: 0.1 10*3/uL (ref 0.0–0.7)
EOS PCT: 1 % (ref 0–5)
HEMATOCRIT: 40.9 % (ref 39.0–52.0)
HEMOGLOBIN: 14.2 g/dL (ref 13.0–17.0)
LYMPHS PCT: 25 % (ref 12–46)
Lymphs Abs: 1.5 10*3/uL (ref 0.7–4.0)
MCH: 35.4 pg — ABNORMAL HIGH (ref 26.0–34.0)
MCHC: 34.7 g/dL (ref 30.0–36.0)
MCV: 102 fL — ABNORMAL HIGH (ref 78.0–100.0)
Monocytes Absolute: 0.6 10*3/uL (ref 0.1–1.0)
Monocytes Relative: 10 % (ref 3–12)
NEUTROS ABS: 3.6 10*3/uL (ref 1.7–7.7)
Neutrophils Relative %: 64 % (ref 43–77)
Platelets: 81 10*3/uL — ABNORMAL LOW (ref 150–400)
RBC: 4.01 MIL/uL — AB (ref 4.22–5.81)
RDW: 12.4 % (ref 11.5–15.5)
Smear Review: DECREASED
WBC: 5.8 10*3/uL (ref 4.0–10.5)

## 2013-11-06 LAB — COMPREHENSIVE METABOLIC PANEL
ALK PHOS: 118 U/L — AB (ref 39–117)
ALT: 88 U/L — AB (ref 0–53)
AST: 157 U/L — ABNORMAL HIGH (ref 0–37)
Albumin: 3.8 g/dL (ref 3.5–5.2)
BILIRUBIN TOTAL: 1.3 mg/dL — AB (ref 0.3–1.2)
BUN: 4 mg/dL — AB (ref 6–23)
CHLORIDE: 89 meq/L — AB (ref 96–112)
CO2: 24 meq/L (ref 19–32)
Calcium: 9.6 mg/dL (ref 8.4–10.5)
Creatinine, Ser: 0.4 mg/dL — ABNORMAL LOW (ref 0.50–1.35)
GLUCOSE: 95 mg/dL (ref 70–99)
POTASSIUM: 3.4 meq/L — AB (ref 3.7–5.3)
SODIUM: 132 meq/L — AB (ref 137–147)
TOTAL PROTEIN: 8.4 g/dL — AB (ref 6.0–8.3)

## 2013-11-06 LAB — POC OCCULT BLOOD, ED: FECAL OCCULT BLD: NEGATIVE

## 2013-11-06 LAB — TYPE AND SCREEN
ABO/RH(D): O POS
ANTIBODY SCREEN: NEGATIVE

## 2013-11-06 LAB — URINALYSIS, ROUTINE W REFLEX MICROSCOPIC
BILIRUBIN URINE: NEGATIVE
GLUCOSE, UA: NEGATIVE mg/dL
HGB URINE DIPSTICK: NEGATIVE
KETONES UR: NEGATIVE mg/dL
Leukocytes, UA: NEGATIVE
Nitrite: NEGATIVE
PROTEIN: NEGATIVE mg/dL
Specific Gravity, Urine: <1.005 — ABNORMAL LOW (ref 1.005–1.030)
UROBILINOGEN UA: 0.2 mg/dL (ref 0.0–1.0)
pH: 6 (ref 5.0–8.0)

## 2013-11-06 LAB — LACTIC ACID, PLASMA: Lactic Acid, Venous: 1.8 mmol/L (ref 0.5–2.2)

## 2013-11-06 LAB — LIPASE, BLOOD: Lipase: 48 U/L (ref 11–59)

## 2013-11-06 LAB — PROTIME-INR
INR: 1.06 (ref 0.00–1.49)
PROTHROMBIN TIME: 13.6 s (ref 11.6–15.2)

## 2013-11-06 MED ORDER — SODIUM CHLORIDE 0.9 % IV SOLN
INTRAVENOUS | Status: DC
  Administered 2013-11-06: 14:00:00 via INTRAVENOUS

## 2013-11-06 MED ORDER — HYDROCODONE-ACETAMINOPHEN 5-325 MG PO TABS
2.0000 | ORAL_TABLET | ORAL | Status: DC | PRN
Start: 2013-11-06 — End: 2014-10-26

## 2013-11-06 MED ORDER — IOHEXOL 300 MG/ML  SOLN
50.0000 mL | Freq: Once | INTRAMUSCULAR | Status: AC | PRN
  Administered 2013-11-06: 50 mL via ORAL

## 2013-11-06 MED ORDER — PROMETHAZINE HCL 25 MG PO TABS: 25.0000 mg | ORAL_TABLET | Freq: Four times a day (QID) | ORAL | Status: DC | PRN

## 2013-11-06 MED ORDER — IOHEXOL 300 MG/ML  SOLN
100.0000 mL | Freq: Once | INTRAMUSCULAR | Status: AC | PRN
  Administered 2013-11-06: 100 mL via INTRAVENOUS

## 2013-11-06 NOTE — ED Notes (Signed)
Pt drinking contrast. Wanted to try to have a bm instead of rectal probe for sample.

## 2013-11-06 NOTE — ED Provider Notes (Signed)
CSN: [id number]     Arrival date & time 11/06/13  1319 History  This chart was scribed for Gilda Creasehristopher J. Tula Schryver, MD by Ardelia Memsylan Malpass, ED Scribe. This patient was seen in room APA10/APA10 and the patient's care was started at 1:30 PM.   Chief Complaint  Patient presents with  . Hematemesis    The history is provided by the patient. No language interpreter was used.    HPI Comments: Brett [name] is a 53 y.o. Male with a history of colitis who presents to the Emergency Department complaining of 4 episodes of hematemesis, described as "dark with chunks of blood" onset this morning at 3 AM. He reports associated nausea and episodes of diarrhea onset today. He describes these episodes as "dark". He states that he has been feeling fatigued intermittently over the past 2 days. He also states that he noticed a tick on his back today, as well as a small area of bruising on his back which was not preceded by any injury. He denies any other pain or symptoms at this time.   Past Medical History  Diagnosis Date  . Colitis    Past Surgical History  Procedure Laterality Date  . Surgery scrotal / testicular     No family history on file. History  Substance Use Topics  . Smoking status: Current Every Day Smoker -- 1.00 packs/day    Types: Cigarettes  . Smokeless tobacco: Not on file  . Alcohol Use: 3.0 oz/week    5 Cans of beer per week     Comment: every weekend.     Review of Systems  Constitutional: Positive for fatigue.  Gastrointestinal: Positive for nausea, vomiting and diarrhea.  All other systems reviewed and are negative.  Allergies  Review of patient's allergies indicates no known allergies.  Home Medications   Prior to Admission medications   Medication Sig Start Date End Date Taking? Authorizing Provider  acetaminophen (TYLENOL) 500 MG tablet Take 1,000 mg by mouth every 6 (six) hours as needed for pain.    Historical Provider, MD  azithromycin (ZITHROMAX Z-PAK) 250 MG  tablet 2 po day one, then 1 daily x 4 days 03/02/13   Donnetta HutchingBrian Cook, MD  ibuprofen (ADVIL,MOTRIN) 200 MG tablet Take 400 mg by mouth every 8 (eight) hours as needed for pain.    Historical Provider, MD  oxyCODONE-acetaminophen (PERCOCET) 5-325 MG per tablet Take 2 tablets by mouth every 4 (four) hours as needed for pain. 03/02/13   Donnetta HutchingBrian Cook, MD  promethazine (PHENERGAN) 25 MG tablet Take 1 tablet (25 mg total) by mouth every 6 (six) hours as needed for nausea. 03/02/13   Donnetta HutchingBrian Cook, MD   Triage Vitals: BP 142/92  Pulse 98  Temp(Src) 97.6 F (36.4 C) (Oral)  Resp 20  SpO2 97%  Physical Exam  Nursing note and vitals reviewed. Constitutional: He is oriented to person, place, and time. He appears well-developed and well-nourished. No distress.  HENT:  Head: Normocephalic and atraumatic.  Right Ear: Hearing normal.  Left Ear: Hearing normal.  Nose: Nose normal.  Mouth/Throat: Oropharynx is clear and moist and mucous membranes are normal.  Eyes: Conjunctivae and EOM are normal. Pupils are equal, round, and reactive to light.  Neck: Normal range of motion. Neck supple.  Cardiovascular: Regular rhythm, S1 normal and S2 normal.  Exam reveals no gallop and no friction rub.   No murmur heard. Pulmonary/Chest: Effort normal and breath sounds normal. No respiratory distress. He exhibits no tenderness.  Abdominal: Soft.  Normal appearance and bowel sounds are normal. There is no hepatosplenomegaly. There is tenderness. There is no rebound, no guarding, no tenderness at McBurney's point and negative Murphy's sign. No hernia.  Left lower quadrant tenderness  Musculoskeletal: Normal range of motion.  Neurological: He is alert and oriented to person, place, and time. He has normal strength. No cranial nerve deficit or sensory deficit. Coordination normal. GCS eye subscore is 4. GCS verbal subscore is 5. GCS motor subscore is 6.  Skin: Skin is warm, dry and intact. No rash noted. No cyanosis.  Small area of  redness on left shoulder. No erythema.   Psychiatric: He has a normal mood and affect. His speech is normal and behavior is normal. Thought content normal.    ED Course  Procedures (including critical care time)  DIAGNOSTIC STUDIES: Oxygen Saturation is 97% on RA, normal by my interpretation.    COORDINATION OF CARE: 1:35 PM- Discussed plan to obtain diagnostic lab work and radiology. Will also order IV fluids. Pt advised of plan for treatment and pt agrees.  Medications  0.9 %  sodium chloride infusion ( Intravenous New Bag/Given 11/06/13 1351)  iohexol (OMNIPAQUE) 300 MG/ML solution 50 mL (50 mLs Oral Contrast Given 11/06/13 1355)  iohexol (OMNIPAQUE) 300 MG/ML solution 100 mL (100 mLs Intravenous Contrast Given 11/06/13 1438)   Labs Review Labs Reviewed  CBC WITH DIFFERENTIAL - Abnormal; Notable for the following:    RBC 4.01 (*)    MCV 102.0 (*)    MCH 35.4 (*)    Platelets 81 (*)    All other components within normal limits  COMPREHENSIVE METABOLIC PANEL - Abnormal; Notable for the following:    Sodium 132 (*)    Potassium 3.4 (*)    Chloride 89 (*)    BUN 4 (*)    Creatinine, Ser 0.40 (*)    Total Protein 8.4 (*)    AST 157 (*)    ALT 88 (*)    Alkaline Phosphatase 118 (*)    Total Bilirubin 1.3 (*)    All other components within normal limits  URINALYSIS, ROUTINE W REFLEX MICROSCOPIC - Abnormal; Notable for the following:    Specific Gravity, Urine <1.005 (*)    All other components within normal limits  PROTIME-INR  LIPASE, BLOOD  LACTIC ACID, PLASMA  POC OCCULT BLOOD, ED  TYPE AND SCREEN   Imaging Review Ct Abdomen Pelvis W Contrast  11/06/2013   CLINICAL DATA:  Left lower quadrant pain  EXAM: CT ABDOMEN AND PELVIS WITH CONTRAST  TECHNIQUE: Multidetector CT imaging of the abdomen and pelvis was performed using the standard protocol following bolus administration of intravenous contrast.  CONTRAST:  50mL OMNIPAQUE IOHEXOL 300 MG/ML SOLN, OMNIPAQUE IOHEXOL  300 MG/ML SOLN  COMPARISON:  CT ABD - PELV W/ CM dated 03/02/2013  FINDINGS: Bilateral posterior and basilar pleural thickening right greater than left is stable. Linear atelectasis or scarring at the lung bases.  Severe diffuse hepatic steatosis is stable.  Spleen, pancreas, adrenal glands, and kidneys are within normal limits.  Bladder is within normal limits. There is a 1.1 x 1.2 x 1.6 cm low density area within the left side of the prostate gland of unknown significance on image 81.  Sigmoid diverticulosis without evidence of acute diverticulitis.  Normal appendix  No free-fluid.  No abnormal retroperitoneal adenopathy.  Shallow right paracentral L5-S1 disc herniation  IMPRESSION: New severe fatty infiltration of the liver is stable. Chronic changes at the lung bases.  Low-density  lesion in the left side of the prostate. Correlate with PSA and physical exam as for the need for biopsy.   Electronically Signed   By: Maryclare BeanArt  Hoss M.D.   On: 11/06/2013 15:02     EKG Interpretation None      MDM   Final diagnoses:  None   Patient presents to the ER for evaluation of weakness, nausea, vomiting and diarrhea. He reports that he has been vomiting blood. He shows me a picture of the vomit on the phone, as well as brown vomitus, did not clearly consistent with bleeding. Rectal exam revealed heme-negative stool.  Patient had mild abdominal tenderness on exam. No signs of acute surgical process. CT scan did not show any acute process. He did have mildly elevated LFTs consistent with his severe hepatic steatosis. No other abnormalities seen.  Patient reports a tick bite on the right shoulder, discovered this morning. No signs or erythema chronicum migrans or other signs of infection, both local and systemic. Patient given precautions.  He also reports bruising of his low back. He has some dark pigmentation area of the lower back bilaterally that does not look like a bruise. Etiology is unclear. The area is  nontender. Normal platelets and PT.  He'll be treated symptomatically as an outpatient, follow up as needed.   I personally performed the services described in this documentation, which was scribed in my presence. The recorded information has been reviewed and is accurate.    Gilda Creasehristopher J. Sigrid Schwebach, MD 11/06/13 [phone]

## 2013-11-06 NOTE — ED Notes (Signed)
Patient given discharge instruction, verbalized understand. IV removed, band aid applied. Patient ambulatory out of the department.  

## 2013-11-06 NOTE — Care Management Note (Signed)
ED/CM noted patient did not have health insurance and/or PCP listed in the computer.  Patient was given the [place] resource handout with information on the clinics, food pantries, and the handout for new health insurance sign-up.  Patient expressed appreciation for information received. 

## 2013-11-06 NOTE — ED Notes (Signed)
Nausea, vomiting blood and diarrhea, also has shortness of breath and tick bite to right upper shoulder

## 2013-11-06 NOTE — Discharge Instructions (Signed)
Diarrhea Diarrhea is frequent loose and watery bowel movements. It can cause you to feel weak and dehydrated. Dehydration can cause you to become tired and thirsty, have a dry mouth, and have decreased urination that often is dark yellow. Diarrhea is a sign of another problem, most often an infection that will not last long. In most cases, diarrhea typically lasts 2 3 days. However, it can last longer if it is a sign of something more serious. It is important to treat your diarrhea as directed by your caregive to lessen or prevent future episodes of diarrhea. CAUSES  Some common causes include:  Gastrointestinal infections caused by viruses, bacteria, or parasites.  Food poisoning or food allergies.  Certain medicines, such as antibiotics, chemotherapy, and laxatives.  Artificial sweeteners and fructose.  Digestive disorders. HOME CARE INSTRUCTIONS  Ensure adequate fluid intake (hydration): have 1 cup (8 oz) of fluid for each diarrhea episode. Avoid fluids that contain simple sugars or sports drinks, fruit juices, whole milk products, and sodas. Your urine should be clear or pale yellow if you are drinking enough fluids. Hydrate with an oral rehydration solution that you can purchase at pharmacies, retail stores, and online. You can prepare an oral rehydration solution at home by mixing the following ingredients together:    tsp table salt.   tsp baking soda.   tsp salt substitute containing potassium chloride.  1  tablespoons sugar.  1 L (34 oz) of water.  Certain foods and beverages may increase the speed at which food moves through the gastrointestinal (GI) tract. These foods and beverages should be avoided and include:  Caffeinated and alcoholic beverages.  High-fiber foods, such as raw fruits and vegetables, nuts, seeds, and whole grain breads and cereals.  Foods and beverages sweetened with sugar alcohols, such as xylitol, sorbitol, and mannitol.  Some foods may be well  tolerated and may help thicken stool including:  Starchy foods, such as rice, toast, pasta, low-sugar cereal, oatmeal, grits, baked potatoes, crackers, and bagels.  Bananas.  Applesauce.  Add probiotic-rich foods to help increase healthy bacteria in the GI tract, such as yogurt and fermented milk products.  Wash your hands well after each diarrhea episode.  Only take over-the-counter or prescription medicines as directed by your caregiver.  Take a warm bath to relieve any burning or pain from frequent diarrhea episodes. SEEK IMMEDIATE MEDICAL CARE IF:   You are unable to keep fluids down.  You have persistent vomiting.  You have blood in your stool, or your stools are black and tarry.  You do not urinate in 6 8 hours, or there is only a small amount of very dark urine.  You have abdominal pain that increases or localizes.  You have weakness, dizziness, confusion, or lightheadedness.  You have a severe headache.  Your diarrhea gets worse or does not get better.  You have a fever or persistent symptoms for more than 2 3 days.  You have a fever and your symptoms suddenly get worse. MAKE SURE YOU:   Understand these instructions.  Will watch your condition.  Will get help right away if you are not doing well or get worse. Document Released: 06/29/2002 Document Revised: 06/25/2012 Document Reviewed: 03/16/2012 [facility] Patient Information 2014 [place], [place].  Nausea and Vomiting Nausea is a sick feeling that often comes before throwing up (vomiting). Vomiting is a reflex where stomach contents come out of your mouth. Vomiting can cause severe loss of body fluids (dehydration). Children and elderly adults can become  dehydrated quickly, especially if they also have diarrhea. Nausea and vomiting are symptoms of a condition or disease. It is important to find the cause of your symptoms. CAUSES   Direct irritation of the stomach lining. This irritation can result from  increased acid production (gastroesophageal reflux disease), infection, food poisoning, taking certain medicines (such as nonsteroidal anti-inflammatory drugs), alcohol use, or tobacco use.  Signals from the brain.These signals could be caused by a headache, heat exposure, an inner ear disturbance, increased pressure in the brain from injury, infection, a tumor, or a concussion, pain, emotional stimulus, or metabolic problems.  An obstruction in the gastrointestinal tract (bowel obstruction).  Illnesses such as diabetes, hepatitis, gallbladder problems, appendicitis, kidney problems, cancer, sepsis, atypical symptoms of a heart attack, or eating disorders.  Medical treatments such as chemotherapy and radiation.  Receiving medicine that makes you sleep (general anesthetic) during surgery. DIAGNOSIS Your caregiver may ask for tests to be done if the problems do not improve after a few days. Tests may also be done if symptoms are severe or if the reason for the nausea and vomiting is not clear. Tests may include:  Urine tests.  Blood tests.  Stool tests.  Cultures (to look for evidence of infection).  X-rays or other imaging studies. Test results can help your caregiver make decisions about treatment or the need for additional tests. TREATMENT You need to stay well hydrated. Drink frequently but in small amounts.You may wish to drink water, sports drinks, clear broth, or eat frozen ice pops or gelatin dessert to help stay hydrated.When you eat, eating slowly may help prevent nausea.There are also some antinausea medicines that may help prevent nausea. HOME CARE INSTRUCTIONS   Take all medicine as directed by your caregiver.  If you do not have an appetite, do not force yourself to eat. However, you must continue to drink fluids.  If you have an appetite, eat a normal diet unless your caregiver tells you differently.  Eat a variety of complex carbohydrates (rice, wheat, potatoes,  bread), lean meats, yogurt, fruits, and vegetables.  Avoid high-fat foods because they are more difficult to digest.  Drink enough water and fluids to keep your urine clear or pale yellow.  If you are dehydrated, ask your caregiver for specific rehydration instructions. Signs of dehydration may include:  Severe thirst.  Dry lips and mouth.  Dizziness.  Dark urine.  Decreasing urine frequency and amount.  Confusion.  Rapid breathing or pulse. SEEK IMMEDIATE MEDICAL CARE IF:   You have blood or brown flecks (like coffee grounds) in your vomit.  You have black or bloody stools.  You have a severe headache or stiff neck.  You are confused.  You have severe abdominal pain.  You have chest pain or trouble breathing.  You do not urinate at least once every 8 hours.  You develop cold or clammy skin.  You continue to vomit for longer than 24 to 48 hours.  You have a fever. MAKE SURE YOU:   Understand these instructions.  Will watch your condition.  Will get help right away if you are not doing well or get worse. Document Released: 07/09/2005 Document Revised: 10/01/2011 Document Reviewed: 12/06/2010 [facility] Patient Information 2014 [place].  Tick Bite Information Ticks are insects that attach themselves to the skin and draw blood for food. There are various types of ticks. Common types include wood ticks and deer ticks. Most ticks live in shrubs and grassy areas. Ticks can climb onto your body  when you make contact with leaves or grass where the tick is waiting. The most common places on the body for ticks to attach themselves are the scalp, neck, armpits, waist, and groin. Most tick bites are harmless, but sometimes ticks carry germs that cause diseases. These germs can be spread to a person during the tick's feeding process. The chance of a disease spreading through a tick bite depends on:   The type of tick.  Time of year.   How long the tick is  attached.   Geographic location.  HOW CAN YOU PREVENT TICK BITES? Take these steps to help prevent tick bites when you are outdoors:  Wear protective clothing. Long sleeves and long pants are best.   Wear white clothes so you can see ticks more easily.  Tuck your pant legs into your socks.   If walking on a trail, stay in the middle of the trail to avoid brushing against bushes.  Avoid walking through areas with long grass.  Put insect repellent on all exposed skin and along boot tops, pant legs, and sleeve cuffs.   Check clothing, hair, and skin repeatedly and before going inside.   Brush off any ticks that are not attached.  Take a shower or bath as soon as possible after being outdoors.  WHAT IS THE PROPER WAY TO REMOVE A TICK? Ticks should be removed as soon as possible to help prevent diseases caused by tick bites. 1. If latex gloves are available, put them on before trying to remove a tick.  2. Using fine-point tweezers, grasp the tick as close to the skin as possible. You may also use curved forceps or a tick removal tool. Grasp the tick as close to its head as possible. Avoid grasping the tick on its body. 3. Pull gently with steady upward pressure until the tick lets go. Do not twist the tick or jerk it suddenly. This may break off the tick's head or mouth parts. 4. Do not squeeze or crush the tick's body. This could force disease-carrying fluids from the tick into your body.  5. After the tick is removed, wash the bite area and your hands with soap and water or other disinfectant such as alcohol. 6. Apply a small amount of antiseptic cream or ointment to the bite site.  7. Wash and disinfect any instruments that were used.  Do not try to remove a tick by applying a hot match, petroleum jelly, or fingernail polish to the tick. These methods do not work and may increase the chances of disease being spread from the tick bite.  WHEN SHOULD YOU SEEK MEDICAL  CARE? Contact your health care provider if you are unable to remove a tick from your skin or if a part of the tick breaks off and is stuck in the skin.  After a tick bite, you need to be aware of signs and symptoms that could be related to diseases spread by ticks. Contact your health care provider if you develop any of the following in the days or weeks after the tick bite:  Unexplained fever.  Rash. A circular rash that appears days or weeks after the tick bite may indicate the possibility of Lyme disease. The rash may resemble a target with a bull's-eye and may occur at a different part of your body than the tick bite.  Redness and swelling in the area of the tick bite.   Tender, swollen lymph glands.   Diarrhea.   Weight loss.  Cough.   Fatigue.   Muscle, joint, or bone pain.   Abdominal pain.   Headache.   Lethargy or a change in your level of consciousness.  Difficulty walking or moving your legs.   Numbness in the legs.   Paralysis.  Shortness of breath.   Confusion.   Repeated vomiting.  Document Released: 07/06/2000 Document Revised: 04/29/2013 Document Reviewed: 12/17/2012 [facility] Patient Information 2014 [place], [place].

## 2014-10-26 ENCOUNTER — Emergency Department (HOSPITAL_COMMUNITY)
Admission: EM | Admit: 2014-10-26 | Discharge: 2014-10-26 | Disposition: A | Discharge status: Home / Self Care | Payer: Self-pay | Attending: Emergency Medicine | Admitting: Emergency Medicine

## 2014-10-26 ENCOUNTER — Encounter (HOSPITAL_COMMUNITY): Payer: Self-pay | Admitting: Emergency Medicine

## 2014-10-26 ENCOUNTER — Emergency Department (HOSPITAL_COMMUNITY): Payer: Self-pay

## 2014-10-26 DIAGNOSIS — Y998 Other external cause status: Secondary | ICD-10-CM | POA: Insufficient documentation

## 2014-10-26 DIAGNOSIS — Y9289 Other specified places as the place of occurrence of the external cause: Secondary | ICD-10-CM | POA: Insufficient documentation

## 2014-10-26 DIAGNOSIS — S8001XA Contusion of right knee, initial encounter: Secondary | ICD-10-CM | POA: Insufficient documentation

## 2014-10-26 DIAGNOSIS — Z79899 Other long term (current) drug therapy: Secondary | ICD-10-CM | POA: Insufficient documentation

## 2014-10-26 DIAGNOSIS — Y9389 Activity, other specified: Secondary | ICD-10-CM | POA: Insufficient documentation

## 2014-10-26 DIAGNOSIS — W500XXA Accidental hit or strike by another person, initial encounter: Secondary | ICD-10-CM | POA: Insufficient documentation

## 2014-10-26 DIAGNOSIS — Z8719 Personal history of other diseases of the digestive system: Secondary | ICD-10-CM | POA: Insufficient documentation

## 2014-10-26 DIAGNOSIS — Z720 Tobacco use: Secondary | ICD-10-CM | POA: Insufficient documentation

## 2014-10-26 MED ORDER — HYDROCODONE-ACETAMINOPHEN 5-325 MG PO TABS: 1.0000 | ORAL_TABLET | ORAL | Status: DC | PRN

## 2014-10-26 MED ORDER — IBUPROFEN 600 MG PO TABS: 600.0000 mg | ORAL_TABLET | Freq: Three times a day (TID) | ORAL | Status: DC
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
## 2014-10-26 NOTE — Discharge Instructions (Signed)
Contusion °A contusion is a deep bruise. Contusions happen when an injury causes bleeding under the skin. Signs of bruising include pain, puffiness (swelling), and discolored skin. The contusion may turn blue, purple, or yellow. °HOME CARE  °· Put ice on the injured area. °¨ Put ice in a plastic bag. °¨ Place a towel between your skin and the bag. °¨ Leave the ice on for 15-20 minutes, 03-04 times a day. °· Only take medicine as told by your doctor. °· Rest the injured area. °· If possible, raise (elevate) the injured area to lessen puffiness. °GET HELP RIGHT AWAY IF:  °· You have more bruising or puffiness. °· You have pain that is getting worse. °· Your puffiness or pain is not helped by medicine. °MAKE SURE YOU:  °· Understand these instructions. °· Will watch your condition. °· Will get help right away if you are not doing well or get worse. °Document Released: 12/26/2007 Document Revised: 10/01/2011 Document Reviewed: 05/14/2011 °ExitCare® Patient Information ©2015 [employer]. This information is not intended to replace advice given to you by your health care provider. Make sure you discuss any questions you have with your health care provider. ° °

## 2015-01-22 ENCOUNTER — Emergency Department (HOSPITAL_COMMUNITY): Payer: Self-pay

## 2015-01-22 ENCOUNTER — Emergency Department (HOSPITAL_COMMUNITY)
Admission: EM | Admit: 2015-01-22 | Discharge: 2015-01-22 | Disposition: A | Discharge status: Home / Self Care | Payer: Self-pay | Attending: Emergency Medicine | Admitting: Emergency Medicine

## 2015-01-22 ENCOUNTER — Encounter (HOSPITAL_COMMUNITY): Payer: Self-pay | Admitting: Emergency Medicine

## 2015-01-22 DIAGNOSIS — Z79899 Other long term (current) drug therapy: Secondary | ICD-10-CM | POA: Insufficient documentation

## 2015-01-22 DIAGNOSIS — S62609A Fracture of unspecified phalanx of unspecified finger, initial encounter for closed fracture: Secondary | ICD-10-CM

## 2015-01-22 DIAGNOSIS — Y9389 Activity, other specified: Secondary | ICD-10-CM | POA: Insufficient documentation

## 2015-01-22 DIAGNOSIS — S5002XA Contusion of left elbow, initial encounter: Secondary | ICD-10-CM | POA: Insufficient documentation

## 2015-01-22 DIAGNOSIS — Y9289 Other specified places as the place of occurrence of the external cause: Secondary | ICD-10-CM | POA: Insufficient documentation

## 2015-01-22 DIAGNOSIS — Y998 Other external cause status: Secondary | ICD-10-CM | POA: Insufficient documentation

## 2015-01-22 DIAGNOSIS — Z720 Tobacco use: Secondary | ICD-10-CM | POA: Insufficient documentation

## 2015-01-22 DIAGNOSIS — Z8719 Personal history of other diseases of the digestive system: Secondary | ICD-10-CM | POA: Insufficient documentation

## 2015-01-22 DIAGNOSIS — S62613A Displaced fracture of proximal phalanx of left middle finger, initial encounter for closed fracture: Secondary | ICD-10-CM | POA: Insufficient documentation

## 2015-01-22 MED ORDER — HYDROCODONE-ACETAMINOPHEN 5-325 MG PO TABS
1.0000 | ORAL_TABLET | Freq: Once | ORAL | Status: AC
  Administered 2015-01-22: 1 via ORAL
  Filled 2015-01-22: qty 1

## 2015-01-22 MED ORDER — TRAMADOL HCL 50 MG PO TABS: 50.0000 mg | ORAL_TABLET | Freq: Four times a day (QID) | ORAL | Status: DC | PRN

## 2015-01-22 NOTE — Discharge Instructions (Signed)
Follow up with dr. Harrison next week. °
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
## 2015-01-22 NOTE — ED Notes (Signed)
Pt c/o of increased pain to LT arm. MD Zammit made aware.

## 2015-08-31 ENCOUNTER — Emergency Department (HOSPITAL_COMMUNITY)
Admission: EM | Admit: 2015-08-31 | Discharge: 2015-08-31 | Disposition: A | Discharge status: Home / Self Care | Payer: Self-pay | Attending: Emergency Medicine | Admitting: Emergency Medicine

## 2015-08-31 ENCOUNTER — Emergency Department (HOSPITAL_COMMUNITY): Payer: Self-pay

## 2015-08-31 ENCOUNTER — Encounter (HOSPITAL_COMMUNITY): Payer: Self-pay | Admitting: Emergency Medicine

## 2015-08-31 DIAGNOSIS — S8991XA Unspecified injury of right lower leg, initial encounter: Secondary | ICD-10-CM | POA: Insufficient documentation

## 2015-08-31 DIAGNOSIS — X58XXXA Exposure to other specified factors, initial encounter: Secondary | ICD-10-CM | POA: Insufficient documentation

## 2015-08-31 DIAGNOSIS — Z8719 Personal history of other diseases of the digestive system: Secondary | ICD-10-CM | POA: Insufficient documentation

## 2015-08-31 DIAGNOSIS — Y998 Other external cause status: Secondary | ICD-10-CM | POA: Insufficient documentation

## 2015-08-31 DIAGNOSIS — Z791 Long term (current) use of non-steroidal anti-inflammatories (NSAID): Secondary | ICD-10-CM | POA: Insufficient documentation

## 2015-08-31 DIAGNOSIS — Y9339 Activity, other involving climbing, rappelling and jumping off: Secondary | ICD-10-CM | POA: Insufficient documentation

## 2015-08-31 DIAGNOSIS — F17210 Nicotine dependence, cigarettes, uncomplicated: Secondary | ICD-10-CM | POA: Insufficient documentation

## 2015-08-31 DIAGNOSIS — Y9289 Other specified places as the place of occurrence of the external cause: Secondary | ICD-10-CM | POA: Insufficient documentation

## 2015-08-31 DIAGNOSIS — M25561 Pain in right knee: Secondary | ICD-10-CM

## 2015-08-31 MED ORDER — OXYCODONE-ACETAMINOPHEN 5-325 MG PO TABS
1.0000 | ORAL_TABLET | Freq: Once | ORAL | Status: AC
  Administered 2015-08-31: 1 via ORAL
  Filled 2015-08-31: qty 1

## 2015-08-31 MED ORDER — PREDNISONE 10 MG PO TABS: 20.0000 mg | ORAL_TABLET | Freq: Two times a day (BID) | ORAL | Status: DC

## 2015-08-31 NOTE — ED Provider Notes (Signed)
CSN: [id number]     Arrival date & time 08/31/15  1720 History   First MD Initiated Contact with Patient 08/31/15 1812     Chief Complaint  Patient presents with  . Knee Pain     (Consider location/radiation/quality/duration/timing/severity/associated sxs/prior Treatment) Patient is a 55 y.o. male presenting with knee pain. The history is provided by the patient.  Knee Pain Location:  Knee Time since incident:  2 days Injury: yes   Mechanism of injury comment:  Jumping off a fence Knee location:  R knee Pain details:    Quality:  Shooting and tearing   Radiates to:  Does not radiate   Severity:  Severe   Onset quality:  Sudden   Timing:  Constant   Progression:  Worsening Chronicity:  New Dislocation: no   Foreign body present:  No foreign bodies Prior injury to area:  Yes Relieved by:  Nothing Worsened by:  Bearing weight Ineffective treatments:  NSAIDs  CESAR ROGERSON is a 55 y.o. male who presents to the ED with right knee pain that started 2 days ago after jumping off a fence and landing on his feet. He wrapped his knee with an ace wrap and took ibuprofen but it has continued to hurt. Patient has had surgery on the same knee by Dr. Romeo Apple.   Past Medical History  Diagnosis Date  . Colitis    Past Surgical History  Procedure Laterality Date  . Surgery scrotal / testicular    . Knee surgery     History reviewed. No pertinent family history. Social History  Substance Use Topics  . Smoking status: Current Every Day Smoker -- 1.00 packs/day    Types: Cigarettes  . Smokeless tobacco: None  . Alcohol Use: 3.0 oz/week    5 Cans of beer per week     Comment: "6 pack daily"    Review of Systems Negative except as stated in HPI   Allergies  Other and Peanut-containing drug products  Home Medications   Prior to Admission medications   Medication Sig Start Date End Date Taking? Authorizing Provider  calcium carbonate (TUMS - DOSED IN MG ELEMENTAL CALCIUM) 500  MG chewable tablet Chew 1 tablet by mouth as needed for indigestion or heartburn.    Historical Provider, MD  HYDROcodone-acetaminophen (NORCO/VICODIN) 5-325 MG per tablet Take 1 tablet by mouth every 4 (four) hours as needed. 10/26/14   Burgess Amor, PA-C  ibuprofen (ADVIL,MOTRIN) 200 MG tablet Take 400 mg by mouth every 6 (six) hours as needed.    Historical Provider, MD  ibuprofen (ADVIL,MOTRIN) 600 MG tablet Take 1 tablet (600 mg total) by mouth 3 (three) times daily. 10/26/14   Burgess Amor, PA-C  predniSONE (DELTASONE) 10 MG tablet Take 2 tablets (20 mg total) by mouth 2 (two) times daily with a meal. 08/31/15   Rodrecus Belsky Orlene Och, NP  promethazine (PHENERGAN) 25 MG tablet Take 1 tablet (25 mg total) by mouth every 6 (six) hours as needed for nausea or vomiting. Patient not taking: Reported on 10/26/2014 11/06/13   Gilda Crease, MD  traMADol (ULTRAM) 50 MG tablet Take 1 tablet (50 mg total) by mouth every 6 (six) hours as needed. 01/22/15   Bethann Berkshire, MD   BP 134/77 mmHg  Pulse 106  Temp(Src) 97.7 F (36.5 C) (Oral)  Resp 15  Ht  (1.778 m)  Wt 70.308 kg  BMI 22.24 kg/m2  SpO2 96% Physical Exam  Constitutional: He is oriented to person, place, and  time. He appears well-developed and well-nourished. No distress.  HENT:  Head: Normocephalic and atraumatic.  Eyes: Conjunctivae and EOM are normal.  Neck: Neck supple.  Cardiovascular: Normal rate.   Pulmonary/Chest: Effort normal.  Abdominal: He exhibits no distension.  Musculoskeletal:       Right knee: He exhibits no ecchymosis, no deformity, no laceration, no erythema and normal alignment. Decreased range of motion: due to pain. Swelling: minimal. Tenderness found. LCL tenderness noted.  Normal pulses RL extremity. Pain with flexion of the knee.   Neurological: He is alert and oriented to person, place, and time. No cranial nerve deficit.  Skin: Skin is warm and dry.  Psychiatric: He has a normal mood and affect. His behavior is  normal.  Nursing note and vitals reviewed.   ED Course  Procedures (including critical care time) Labs Review Labs Reviewed - No data to display  Imaging Review Dg Knee Complete 4 Views Right  08/31/2015  CLINICAL DATA:  Jumped from 10 feet.  Right knee pain. EXAM: RIGHT KNEE - COMPLETE 4+ VIEW COMPARISON:  10/26/2014 FINDINGS: There is no evidence of fracture, dislocation, or joint effusion. There is no evidence of arthropathy or other focal bone abnormality. Soft tissues are unremarkable. There is peripheral vascular atherosclerotic disease. IMPRESSION: No acute osseous injury of the right knee. Electronically Signed   By: Elige Ko   On: 08/31/2015 18:00   Patient has a knee brace at home from prior surgery that he will use.   MDM  55 y.o. male with pain to the LCL of the right knee stable for d/c without focal neuro deficits. Ambulatory to the ED without difficulty. Discussed with the patient clinical and x-ray findings and plan of care and all questioned fully answered. He will follow up with Dr. Hilda Lias or return here if any problems arise.   Final diagnoses:  Knee pain, acute, right      [facility], NP 08/31/15 8295  Mancel Bale, MD 09/01/15 [phone]

## 2015-08-31 NOTE — Discharge Instructions (Signed)
Wear your knee brace, elevate the knee as often as possible and call Dr. Romeo Apple for follow up.

## 2015-08-31 NOTE — ED Notes (Addendum)
Patient complaining of right knee pain after jumping off a fence 2 days ago. Patient admits to smoking marijuana 2 hours prior to arrival to ED. Also admits to drinking "a couple of beers" prior to arrival.

## 2016-09-19 ENCOUNTER — Inpatient Hospital Stay (HOSPITAL_COMMUNITY)
Admission: EM | Admit: 2016-09-19 | Discharge: 2016-09-22 | DRG: 369 | Disposition: A | Discharge status: Home / Self Care | Payer: Self-pay | Attending: Family Medicine | Admitting: Family Medicine

## 2016-09-19 ENCOUNTER — Inpatient Hospital Stay (HOSPITAL_COMMUNITY): Payer: Self-pay | Admitting: Anesthesiology

## 2016-09-19 ENCOUNTER — Emergency Department (HOSPITAL_COMMUNITY): Payer: Self-pay

## 2016-09-19 ENCOUNTER — Encounter (HOSPITAL_COMMUNITY): Payer: Self-pay | Admitting: *Deleted

## 2016-09-19 ENCOUNTER — Encounter (HOSPITAL_COMMUNITY)
Admission: EM | Disposition: A | Discharge status: Home / Self Care | Payer: Self-pay | Source: Home / Self Care | Attending: Family Medicine

## 2016-09-19 DIAGNOSIS — S51811A Laceration without foreign body of right forearm, initial encounter: Secondary | ICD-10-CM | POA: Diagnosis present

## 2016-09-19 DIAGNOSIS — I8500 Esophageal varices without bleeding: Secondary | ICD-10-CM

## 2016-09-19 DIAGNOSIS — K269 Duodenal ulcer, unspecified as acute or chronic, without hemorrhage or perforation: Secondary | ICD-10-CM | POA: Diagnosis present

## 2016-09-19 DIAGNOSIS — K920 Hematemesis: Secondary | ICD-10-CM

## 2016-09-19 DIAGNOSIS — K210 Gastro-esophageal reflux disease with esophagitis: Secondary | ICD-10-CM | POA: Diagnosis present

## 2016-09-19 DIAGNOSIS — K7010 Alcoholic hepatitis without ascites: Secondary | ICD-10-CM | POA: Diagnosis present

## 2016-09-19 DIAGNOSIS — K766 Portal hypertension: Secondary | ICD-10-CM

## 2016-09-19 DIAGNOSIS — K922 Gastrointestinal hemorrhage, unspecified: Secondary | ICD-10-CM

## 2016-09-19 DIAGNOSIS — F1010 Alcohol abuse, uncomplicated: Secondary | ICD-10-CM | POA: Diagnosis present

## 2016-09-19 DIAGNOSIS — K2210 Ulcer of esophagus without bleeding: Secondary | ICD-10-CM | POA: Diagnosis present

## 2016-09-19 DIAGNOSIS — K449 Diaphragmatic hernia without obstruction or gangrene: Secondary | ICD-10-CM | POA: Diagnosis present

## 2016-09-19 DIAGNOSIS — D62 Acute posthemorrhagic anemia: Secondary | ICD-10-CM | POA: Diagnosis present

## 2016-09-19 DIAGNOSIS — M545 Low back pain: Secondary | ICD-10-CM | POA: Diagnosis present

## 2016-09-19 DIAGNOSIS — F17210 Nicotine dependence, cigarettes, uncomplicated: Secondary | ICD-10-CM | POA: Diagnosis present

## 2016-09-19 DIAGNOSIS — K3189 Other diseases of stomach and duodenum: Secondary | ICD-10-CM

## 2016-09-19 DIAGNOSIS — K921 Melena: Secondary | ICD-10-CM

## 2016-09-19 DIAGNOSIS — I8501 Esophageal varices with bleeding: Principal | ICD-10-CM | POA: Diagnosis present

## 2016-09-19 DIAGNOSIS — K529 Noninfective gastroenteritis and colitis, unspecified: Secondary | ICD-10-CM | POA: Diagnosis present

## 2016-09-19 DIAGNOSIS — K284 Chronic or unspecified gastrojejunal ulcer with hemorrhage: Secondary | ICD-10-CM

## 2016-09-19 DIAGNOSIS — K7030 Alcoholic cirrhosis of liver without ascites: Secondary | ICD-10-CM | POA: Diagnosis present

## 2016-09-19 DIAGNOSIS — J45909 Unspecified asthma, uncomplicated: Secondary | ICD-10-CM | POA: Diagnosis present

## 2016-09-19 DIAGNOSIS — K228 Other specified diseases of esophagus: Secondary | ICD-10-CM

## 2016-09-19 DIAGNOSIS — W19XXXA Unspecified fall, initial encounter: Secondary | ICD-10-CM | POA: Diagnosis present

## 2016-09-19 DIAGNOSIS — D696 Thrombocytopenia, unspecified: Secondary | ICD-10-CM | POA: Diagnosis present

## 2016-09-19 DIAGNOSIS — Z91018 Allergy to other foods: Secondary | ICD-10-CM

## 2016-09-19 HISTORY — PX: ESOPHAGOGASTRODUODENOSCOPY (EGD) WITH PROPOFOL: SHX5813

## 2016-09-19 LAB — CBC WITH DIFFERENTIAL/PLATELET
Basophils Absolute: 0 10*3/uL (ref 0.0–0.1)
Basophils Relative: 0 %
EOS PCT: 3 %
Eosinophils Absolute: 0.2 10*3/uL (ref 0.0–0.7)
HCT: 32.8 % — ABNORMAL LOW (ref 39.0–52.0)
Hemoglobin: 11.4 g/dL — ABNORMAL LOW (ref 13.0–17.0)
Lymphocytes Relative: 39 %
Lymphs Abs: 2.3 10*3/uL (ref 0.7–4.0)
MCH: 35.8 pg — ABNORMAL HIGH (ref 26.0–34.0)
MCHC: 34.8 g/dL (ref 30.0–36.0)
MCV: 103.1 fL — ABNORMAL HIGH (ref 78.0–100.0)
MONO ABS: 0.6 10*3/uL (ref 0.1–1.0)
Monocytes Relative: 11 %
Neutro Abs: 2.8 10*3/uL (ref 1.7–7.7)
Neutrophils Relative %: 47 %
PLATELETS: 70 10*3/uL — AB (ref 150–400)
RBC: 3.18 MIL/uL — ABNORMAL LOW (ref 4.22–5.81)
RDW: 14.1 % (ref 11.5–15.5)
WBC: 5.9 10*3/uL (ref 4.0–10.5)

## 2016-09-19 LAB — COMPREHENSIVE METABOLIC PANEL
ALK PHOS: 86 U/L (ref 38–126)
ALT: 64 U/L — ABNORMAL HIGH (ref 17–63)
ANION GAP: 10 (ref 5–15)
AST: 133 U/L — ABNORMAL HIGH (ref 15–41)
Albumin: 3.2 g/dL — ABNORMAL LOW (ref 3.5–5.0)
BUN: 10 mg/dL (ref 6–20)
CO2: 22 mmol/L (ref 22–32)
Calcium: 7.6 mg/dL — ABNORMAL LOW (ref 8.9–10.3)
Chloride: 103 mmol/L (ref 101–111)
Creatinine, Ser: 0.56 mg/dL — ABNORMAL LOW (ref 0.61–1.24)
GFR calc Af Amer: >60 mL/min (ref >60)
GLUCOSE: 100 mg/dL — AB (ref 65–99)
Potassium: 3.9 mmol/L (ref 3.5–5.1)
Sodium: 135 mmol/L (ref 135–145)
TOTAL PROTEIN: 6.3 g/dL — AB (ref 6.5–8.1)
Total Bilirubin: 1.4 mg/dL — ABNORMAL HIGH (ref 0.3–1.2)

## 2016-09-19 LAB — CBC
HCT: 37 % — ABNORMAL LOW (ref 39.0–52.0)
HEMOGLOBIN: 12.8 g/dL — AB (ref 13.0–17.0)
MCH: 34 pg (ref 26.0–34.0)
MCHC: 34.6 g/dL (ref 30.0–36.0)
MCV: 98.4 fL (ref 78.0–100.0)
PLATELETS: 49 10*3/uL — AB (ref 150–400)
RBC: 3.76 MIL/uL — AB (ref 4.22–5.81)
RDW: 17.2 % — ABNORMAL HIGH (ref 11.5–15.5)
WBC: 5.6 10*3/uL (ref 4.0–10.5)

## 2016-09-19 LAB — RESPIRATORY PANEL BY PCR
Adenovirus: NOT DETECTED
Bordetella pertussis: NOT DETECTED
CHLAMYDOPHILA PNEUMONIAE-RVPPCR: NOT DETECTED
CORONAVIRUS OC43-RVPPCR: NOT DETECTED
Coronavirus 229E: NOT DETECTED
Coronavirus HKU1: NOT DETECTED
Coronavirus NL63: NOT DETECTED
INFLUENZA A-RVPPCR: NOT DETECTED
INFLUENZA B-RVPPCR: NOT DETECTED
MYCOPLASMA PNEUMONIAE-RVPPCR: NOT DETECTED
Metapneumovirus: NOT DETECTED
PARAINFLUENZA VIRUS 1-RVPPCR: NOT DETECTED
PARAINFLUENZA VIRUS 3-RVPPCR: NOT DETECTED
PARAINFLUENZA VIRUS 4-RVPPCR: NOT DETECTED
Parainfluenza Virus 2: NOT DETECTED
RESPIRATORY SYNCYTIAL VIRUS-RVPPCR: NOT DETECTED
RHINOVIRUS / ENTEROVIRUS - RVPPCR: NOT DETECTED

## 2016-09-19 LAB — LIPASE, BLOOD: Lipase: 35 U/L (ref 11–51)

## 2016-09-19 LAB — I-STAT CHEM 8, ED
BUN: 9 mg/dL (ref 6–20)
CALCIUM ION: 1.03 mmol/L — AB (ref 1.15–1.40)
CREATININE: 0.6 mg/dL — AB (ref 0.61–1.24)
Chloride: 102 mmol/L (ref 101–111)
GLUCOSE: 94 mg/dL (ref 65–99)
HCT: 32 % — ABNORMAL LOW (ref 39.0–52.0)
Hemoglobin: 10.9 g/dL — ABNORMAL LOW (ref 13.0–17.0)
Potassium: 3.8 mmol/L (ref 3.5–5.1)
SODIUM: 140 mmol/L (ref 135–145)
TCO2: 22 mmol/L (ref 0–100)

## 2016-09-19 LAB — TROPONIN I

## 2016-09-19 LAB — PROTIME-INR
INR: 1.3
Prothrombin Time: 16.3 seconds — ABNORMAL HIGH (ref 11.4–15.2)

## 2016-09-19 LAB — PREPARE RBC (CROSSMATCH)

## 2016-09-19 LAB — INFLUENZA PANEL BY PCR (TYPE A & B)
Influenza A By PCR: NEGATIVE
Influenza B By PCR: NEGATIVE

## 2016-09-19 LAB — POC OCCULT BLOOD, ED: Fecal Occult Bld: POSITIVE — AB

## 2016-09-19 LAB — MRSA PCR SCREENING: MRSA by PCR: NEGATIVE

## 2016-09-19 LAB — APTT: aPTT: 28 seconds (ref 24–36)

## 2016-09-19 LAB — I-STAT CG4 LACTIC ACID, ED: LACTIC ACID, VENOUS: 4.25 mmol/L — AB (ref 0.5–1.9)

## 2016-09-19 SURGERY — ESOPHAGOGASTRODUODENOSCOPY (EGD) WITH PROPOFOL
Anesthesia: Monitor Anesthesia Care

## 2016-09-19 MED ORDER — TETANUS-DIPHTH-ACELL PERTUSSIS 5-2.5-18.5 LF-MCG/0.5 IM SUSP
0.5000 mL | Freq: Once | INTRAMUSCULAR | Status: AC
  Administered 2016-09-19: 0.5 mL via INTRAMUSCULAR
  Filled 2016-09-19: qty 0.5

## 2016-09-19 MED ORDER — ADULT MULTIVITAMIN W/MINERALS CH
1.0000 | ORAL_TABLET | Freq: Every day | ORAL | Status: DC
  Administered 2016-09-20 – 2016-09-22 (×3): 1 via ORAL
  Filled 2016-09-19 (×3): qty 1

## 2016-09-19 MED ORDER — SODIUM CHLORIDE 0.9 % IV SOLN
8.0000 mg/h | INTRAVENOUS | Status: DC
  Administered 2016-09-19 – 2016-09-20 (×3): 8 mg/h via INTRAVENOUS
  Filled 2016-09-19 (×6): qty 80

## 2016-09-19 MED ORDER — SODIUM CHLORIDE 0.9 % IV SOLN
INTRAVENOUS | Status: DC
  Administered 2016-09-19 – 2016-09-21 (×3): via INTRAVENOUS

## 2016-09-19 MED ORDER — MIDAZOLAM HCL 2 MG/2ML IJ SOLN
INTRAMUSCULAR | Status: AC
  Filled 2016-09-19: qty 2

## 2016-09-19 MED ORDER — SODIUM CHLORIDE 0.9 % IV SOLN
INTRAVENOUS | Status: DC
  Administered 2016-09-19 (×2): via INTRAVENOUS

## 2016-09-19 MED ORDER — SUCRALFATE 1 GM/10ML PO SUSP
1.0000 g | Freq: Three times a day (TID) | ORAL | Status: DC
  Administered 2016-09-19 – 2016-09-22 (×12): 1 g via ORAL
  Filled 2016-09-19 (×12): qty 10

## 2016-09-19 MED ORDER — THIAMINE HCL 100 MG/ML IJ SOLN
100.0000 mg | Freq: Every day | INTRAMUSCULAR | Status: DC
Start: 2016-09-19 — End: 2016-09-22
  Administered 2016-09-20 – 2016-09-22 (×3): 100 mg via INTRAVENOUS
  Filled 2016-09-19 (×3): qty 2

## 2016-09-19 MED ORDER — FENTANYL CITRATE (PF) 100 MCG/2ML IJ SOLN
25.0000 ug | INTRAMUSCULAR | Status: AC
  Administered 2016-09-19 (×2): 25 ug via INTRAVENOUS

## 2016-09-19 MED ORDER — NICOTINE 14 MG/24HR TD PT24
14.0000 mg | MEDICATED_PATCH | Freq: Every day | TRANSDERMAL | Status: DC
  Administered 2016-09-19 – 2016-09-21 (×3): 14 mg via TRANSDERMAL
  Filled 2016-09-19 (×3): qty 1

## 2016-09-19 MED ORDER — METOCLOPRAMIDE HCL 5 MG/ML IJ SOLN
10.0000 mg | Freq: Once | INTRAMUSCULAR | Status: AC
  Administered 2016-09-19: 10 mg via INTRAVENOUS
  Filled 2016-09-19: qty 2

## 2016-09-19 MED ORDER — LIDOCAINE HCL (CARDIAC) 10 MG/ML IV SOLN
INTRAVENOUS | Status: DC | PRN
  Administered 2016-09-19: 40 mg via INTRAVENOUS

## 2016-09-19 MED ORDER — FOLIC ACID 5 MG/ML IJ SOLN
1.0000 mg | Freq: Every day | INTRAMUSCULAR | Status: DC
  Administered 2016-09-20 – 2016-09-21 (×2): 1 mg via INTRAVENOUS
  Filled 2016-09-19 (×6): qty 0.2

## 2016-09-19 MED ORDER — SODIUM CHLORIDE 0.9 % IV SOLN
50.0000 ug/h | INTRAVENOUS | Status: DC
  Administered 2016-09-19 – 2016-09-20 (×5): 50 ug/h via INTRAVENOUS
  Filled 2016-09-19 (×14): qty 1

## 2016-09-19 MED ORDER — LACTATED RINGERS IV SOLN: INTRAVENOUS | Status: DC

## 2016-09-19 MED ORDER — LIDOCAINE-EPINEPHRINE (PF) 1 %-1:200000 IJ SOLN
20.0000 mL | Freq: Once | INTRAMUSCULAR | Status: AC
  Administered 2016-09-19: 20 mL
  Filled 2016-09-19: qty 30

## 2016-09-19 MED ORDER — OCTREOTIDE LOAD VIA INFUSION
50.0000 ug | Freq: Once | INTRAVENOUS | Status: AC
  Administered 2016-09-19: 50 ug via INTRAVENOUS
  Filled 2016-09-19: qty 25

## 2016-09-19 MED ORDER — PROPOFOL 10 MG/ML IV BOLUS
INTRAVENOUS | Status: AC
  Filled 2016-09-19: qty 40

## 2016-09-19 MED ORDER — SODIUM CHLORIDE 0.9 % IV SOLN
INTRAVENOUS | Status: DC
  Administered 2016-09-19: 14:00:00 via INTRAVENOUS

## 2016-09-19 MED ORDER — LIDOCAINE VISCOUS 2 % MT SOLN
OROMUCOSAL | Status: DC | PRN
  Administered 2016-09-19: 3 mL via OROMUCOSAL

## 2016-09-19 MED ORDER — FENTANYL CITRATE (PF) 100 MCG/2ML IJ SOLN
INTRAMUSCULAR | Status: AC
Start: 2016-09-19 — End: 2016-09-19
  Filled 2016-09-19: qty 2

## 2016-09-19 MED ORDER — LORAZEPAM 2 MG/ML IJ SOLN
2.0000 mg | INTRAMUSCULAR | Status: DC | PRN
  Administered 2016-09-19: 2 mg via INTRAVENOUS
  Filled 2016-09-19: qty 1

## 2016-09-19 MED ORDER — MIDAZOLAM HCL 2 MG/2ML IJ SOLN
1.0000 mg | INTRAMUSCULAR | Status: AC
  Administered 2016-09-19: 2 mg via INTRAVENOUS

## 2016-09-19 MED ORDER — MORPHINE SULFATE (PF) 2 MG/ML IV SOLN
2.0000 mg | INTRAVENOUS | Status: AC | PRN
  Administered 2016-09-19 – 2016-09-20 (×3): 2 mg via INTRAVENOUS
  Filled 2016-09-19 (×3): qty 1

## 2016-09-19 MED ORDER — SODIUM CHLORIDE 0.9 % IV SOLN
80.0000 mg | Freq: Once | INTRAVENOUS | Status: AC
  Administered 2016-09-19: 80 mg via INTRAVENOUS
  Filled 2016-09-19: qty 80

## 2016-09-19 MED ORDER — LIDOCAINE HCL (PF) 1 % IJ SOLN
INTRAMUSCULAR | Status: AC
  Filled 2016-09-19: qty 5

## 2016-09-19 MED ORDER — MIDAZOLAM HCL 5 MG/5ML IJ SOLN
INTRAMUSCULAR | Status: DC | PRN
  Administered 2016-09-19: 2 mg via INTRAVENOUS

## 2016-09-19 MED ORDER — ONDANSETRON HCL 4 MG/2ML IJ SOLN
4.0000 mg | Freq: Once | INTRAMUSCULAR | Status: AC
  Administered 2016-09-19: 4 mg via INTRAVENOUS
  Filled 2016-09-19: qty 2

## 2016-09-19 MED ORDER — SODIUM CHLORIDE 0.9 % IV BOLUS (SEPSIS)
1000.0000 mL | Freq: Once | INTRAVENOUS | Status: AC
  Administered 2016-09-19: 1000 mL via INTRAVENOUS

## 2016-09-19 MED ORDER — CEFTRIAXONE SODIUM 1 G IJ SOLR
1.0000 g | INTRAMUSCULAR | Status: DC
  Administered 2016-09-19 – 2016-09-21 (×3): 1 g via INTRAVENOUS
  Filled 2016-09-19 (×5): qty 10

## 2016-09-19 MED ORDER — PROPOFOL 500 MG/50ML IV EMUL
INTRAVENOUS | Status: DC | PRN
  Administered 2016-09-19: 150 ug/kg/min via INTRAVENOUS

## 2016-09-19 MED ORDER — OCTREOTIDE ACETATE 500 MCG/ML IJ SOLN
INTRAMUSCULAR | Status: AC
  Filled 2016-09-19: qty 1

## 2016-09-19 MED ORDER — PANTOPRAZOLE SODIUM 40 MG IV SOLR
INTRAVENOUS | Status: AC
  Filled 2016-09-19: qty 160

## 2016-09-19 NOTE — ED Notes (Signed)
Dr Manus Gunningancour suturing right lower arm laceration.

## 2016-09-19 NOTE — ED Notes (Signed)
Dr. Rehman at bedside. 

## 2016-09-19 NOTE — Transfer of Care (Signed)
Immediate Anesthesia Transfer of Care Note  Patient: Brett [name]  Procedure(s) Performed: Procedure(s) with comments: ESOPHAGOGASTRODUODENOSCOPY (EGD) WITH PROPOFOL (N/A) - possible esophageal variceal banding  Patient Location: PACU  Anesthesia Type:MAC  Level of Consciousness: awake, alert , oriented and patient cooperative  Airway & Oxygen Therapy: Patient Spontanous Breathing and Patient connected to face mask oxygen  Post-op Assessment: Report given to RN and Post -op Vital signs reviewed and stable  Post vital signs: Reviewed and stable  Last Vitals:  Vitals:   09/19/16 1415 09/19/16 1430  BP: 136/79   Pulse:    Resp: 19 (!) 41  Temp:      Last Pain:  Vitals:   09/19/16 1433  TempSrc:   PainSc: 6       Patients Stated Pain Goal: 4 (50/35/46 5681)  Complications: No apparent anesthesia complications

## 2016-09-19 NOTE — Op Note (Signed)
[facility] Patient Name: Brett [name] Procedure Date: 09/19/2016 1:35 PM MRN: [id number] Date of Birth: 1961-05-26 Attending MD: Lionel December , MD CSN: [id number] Age: 56 Admit Type: Inpatient Procedure:                Upper GI endoscopy Indications:              Hematemesis Providers:                Lionel December, MD, Criselda Peaches. Patsy Lager, RN, Dyann Ruddle Referring MD:             Glynn Octave, MD Medicines:                viscous lidocaine for topical anaesthesia, Propofol                            per Anesthesia Complications:            No immediate complications. Estimated Blood Loss:     Estimated blood loss: none. Procedure:                Pre-Anesthesia Assessment:                           - Prior to the procedure, a History and Physical                            was performed, and patient medications and                            allergies were reviewed. The patient's tolerance of                            previous anesthesia was also reviewed. The risks                            and benefits of the procedure and the sedation                            options and risks were discussed with the patient.                            All questions were answered, and informed consent                            was obtained. Prior Anticoagulants: The patient has                            taken no previous anticoagulant or antiplatelet                            agents. ASA Grade Assessment: III - A patient with  severe systemic disease. After reviewing the risks                            and benefits, the patient was deemed in                            satisfactory condition to undergo the procedure.                           After obtaining informed consent, the endoscope was                            passed under direct vision. Throughout the                            procedure, the patient's blood pressure,  pulse, and                            oxygen saturations were monitored continuously. The                            was introduced through the mouth, and advanced to                            the second part of duodenum. The upper GI endoscopy                            was accomplished without difficulty. The patient                            tolerated the procedure well. Scope In: 2:42:31 PM Scope Out: 2:57:22 PM Total Procedure Duration: 0 hours 14 minutes 51 seconds  Findings:      The proximal esophagus was normal.      Grade III varices were found in the lower third of the esophagus. Four       bands were successfully placed with incomplete eradication of varices.       There was no bleeding at the end of the procedure.      One linear esophageal ulcer with no bleeding and no stigmata of recent       bleeding was found 36 to 37 cm from the incisors. The lesion was five mm       by twelve mm in largest dimension.      The Z-line was irregular and was found 37 cm from the incisors.      A 2 cm hiatal hernia was present.      Hematin (altered blood/coffee-ground-like material) was found in the       gastric body.      Mild portal hypertensive gastropathy was found in the gastric fundus and       in the gastric body.      The exam of the stomach was otherwise normal.      Two non-bleeding superficial duodenal ulcers with no stigmata of       bleeding were found in the duodenal bulb. The largest lesion was 3 mm in  largest dimension.      The duodenal bulb was normal. Impression:               - Normal proximal esophagus.                           - Grade III esophageal varices. Incompletely                            eradicated. Banded.                           - Non-bleeding esophageal ulcer.                           - Z-line irregular, 37 cm from the incisors.                           - 2 cm hiatal hernia.                           - Hematin (altered  blood/coffee-ground-like                            material) in the gastric body.                           - Portal hypertensive gastropathy.                           - Multiple non-bleeding duodenal ulcers with no                            stigmata of bleeding.                           - Normal duodenal bulb.                           - No specimens collected.                           Comment: Four columns of esophageal varices noted.                            Three were grade III. Fourth column small.                           Two columns with red markings.                           Three columns were banded. Moderate Sedation:      Per Anesthesia Care Recommendation:           - Return patient to ICU for ongoing care.                           - Clear liquid diet today.                           -  Continue present medications.                           - H. Pylori serology.                           - Sucralfate 1 gm po ac and qhs.                           - Repeat upper endoscopy in 6 weeks. Procedure Code(s):        --- Professional ---                           [phone], Esophagogastroduodenoscopy, flexible,                            transoral; with band ligation of esophageal/gastric                            varices Diagnosis Code(s):        --- Professional ---                           I85.00, Esophageal varices without bleeding                           K22.10, Ulcer of esophagus without bleeding                           K22.8, Other specified diseases of esophagus                           K44.9, Diaphragmatic hernia without obstruction or                            gangrene                           K92.2, Gastrointestinal hemorrhage, unspecified                           K76.6, Portal hypertension                           K31.89, Other diseases of stomach and duodenum                           K26.9, Duodenal ulcer, unspecified as acute or                             chronic, without hemorrhage or perforation                           K92.0, Hematemesis CPT copyright 2016 American Medical Association. All rights reserved. The codes documented in this report are preliminary and upon coder review may  be revised to meet current compliance requirements. Lionel December, MD Lionel December, MD 09/19/2016 3:33:12 PM This report has been signed electronically.  Number of Addenda: 0 

## 2016-09-19 NOTE — Consult Note (Signed)
Referring Provider: No ref. provider found Primary Care Physician:  Brett StallingNDIEGO,[name], MD Primary Gastroenterologist:  Dr. Karilyn Cotaehman  Reason for Consultation:    Upper GI bleed.  HPI:   History is obtained from the patient. Patient is 56 year old Caucasian male who drinks at least 8 cans of beer every day was in usual state of health last night. He went to sleep while watching basketball game. He woke up around 1 AM with nausea and ran to the bathroom and leaned over to mode and vomited large amount of blood. Also noted epigastric pain. He then woke up on the bathroom floor at 4 AM and called for help. Patient was brought to emergency room. Abs studies were ordered. His hemoglobin is 11.4 and platelet count 70 K. He was also noted to have laceration to right forearm which has been sutured by Dr. Manus Gunningancour. Patient was given 1 unit of PRBCs anticipating that his hemoglobin would drop significantly. Patient complains of epigastric pain. He is not nauseated anymore. He did notice his stool to be black earlier today. His wife states that he has been losing weight gradually. She believes he has lost over 30 pounds over the last couple of years. Patient states that he has good appetite. He rarely takes Advil. He does not take any prescription medications. He states that he has never been treated for alcoholic liver disease or pancreatitis. He is married and has 1 grownup son was at bedside. 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 He has been drinking alcohol for many years. He states he is been drinking daily for the last couple of years and he states he drinks as many as 8 cans of beer per day. He smokes one pack of cigarettes daily. He has been smoking for over 30 years.    Past Medical History:  Diagnosis Date  . Remote history of colitis possibly self-limiting.         Chronic alcohol abuse.  Past Surgical History:  Procedure Laterality Date  . KNEE  SURGERY    . SURGERY SCROTAL / TESTICULAR      Prior to Admission medications   Not on File    Current Facility-Administered Medications  Medication Dose Route Frequency Provider Last Rate Last Dose  . 0.9 %  sodium chloride infusion   Intravenous Continuous Glynn OctaveStephen Rancour, MD 125 mL/hr at 09/19/16 0507    . octreotide (SANDOSTATIN) 500 mcg in sodium chloride 0.9 % 250 mL (2 mcg/mL) infusion  50 mcg/hr Intravenous Continuous Glynn OctaveStephen Rancour, MD 25 mL/hr at 09/19/16 0540 50 mcg/hr at 09/19/16 0540  . pantoprazole (PROTONIX) 80 mg in sodium chloride 0.9 % 250 mL (0.32 mg/mL) infusion  8 mg/hr Intravenous Continuous Glynn OctaveStephen Rancour, MD 25 mL/hr at 09/19/16 0527 8 mg/hr at 09/19/16 0527   No current outpatient prescriptions on file.    Allergies as of 09/19/2016 - Review Complete 09/19/2016  Allergen Reaction Noted  . Other  01/22/2015  . Peanut-containing drug products  08/31/2015    History reviewed. No pertinent family history.  Social History   Social History  . Marital status: Married    Spouse name: N/A  . Number of children: N/A  . Years of education: N/A   Occupational History  . Not on file.   Social History Main Topics  . Smoking status: Current Every Day Smoker    Packs/day: 1.00    Types: Cigarettes  . Smokeless tobacco: Never Used  . Alcohol use 3.0  oz/week    5 Cans of beer per week     Comment: "6 pack daily"  . Drug use: Yes    Types: Marijuana     Comment: last use 1530 today  . Sexual activity: Not on file   Other Topics Concern  . Not on file   Social History Narrative  . No narrative on file    Review of Systems: See HPI, otherwise normal ROS  Physical Exam: Temp:  [97.7 F (36.5 C)-98.4 F (36.9 C)] 98.4 F (36.9 C) (02/28 0819) Pulse Rate:  [87-106] 97 (02/28 0815) Resp:  [12-30] 18 (02/28 0815) BP: (99-149)/(53-80) 149/75 (02/28 0800) SpO2:  [92 %-97 %] 96 % (02/28 0815)   Patient was seen in emergency room with his son and wife  at bedside. Patient is alert and in no acute distress. He does not have asterixis or tremors. Conjunctiva was pink. Sclerae nonicteric. Oropharyngeal mucosa is normal. No neck masses or thyromegaly noted. On exam with regular rhythm normal S1 and S2. No murmur or gallop noted. Abdomen is flat. Bowel sounds are normal. Palpation abdomen is soft with mild midepigastric tenderness. Liver edge is palpable below RCM its somewhat firm. Spleen is not palpable. Extremities are thin but no clubbing noted.    Lab Results:  Recent Labs  09/19/16 0445 09/19/16 0527  WBC 5.9  --   HGB 11.4* 10.9*  HCT 32.8* 32.0*  PLT 70*  --    BMET  Recent Labs  09/19/16 0445 09/19/16 0527  NA 135 140  K 3.9 3.8  CL 103 102  CO2 22  --   GLUCOSE 100* 94  BUN 10 9  CREATININE 0.56* 0.60*  CALCIUM 7.6*  --    LFT  Recent Labs  09/19/16 0445  PROT 6.3*  ALBUMIN 3.2*  AST 133*  ALT 64*  ALKPHOS 86  BILITOT 1.4*   PT/INR  Recent Labs  09/19/16 0445  LABPROT 16.3*  INR 1.30    Studies/Results: Dg Forearm Right  Result Date: 09/19/2016 CLINICAL DATA:  Woke up at 1 a.[name]. vomiting blood, back bruising and laceration. Daily alcohol intake. EXAM: RIGHT FOREARM - 2 VIEW COMPARISON:  None. FINDINGS: There is no evidence of fracture or other focal bone lesions. Mid forearm soft tissue swelling and subcutaneous gas. Intravenous catheter projects an antecubital fossa. IMPRESSION: Mid forearm soft tissue swelling/laceration. No acute osseous process. Electronically Signed   By: Awilda Metro [name].D.   On: 09/19/2016 05:22   Dg Abd 1 View  Result Date: 09/19/2016 CLINICAL DATA:  Woke up at 1 a.[name]. vomiting blood, back bruising and laceration. Daily alcohol intake. EXAM: ABDOMEN - 1 VIEW COMPARISON:  None. FINDINGS: The bowel gas pattern is normal. No radio-opaque calculi or other significant radiographic abnormality are seen. IMPRESSION: Negative. Electronically Signed   By: Awilda Metro [name].D.    On: 09/19/2016 05:19   Dg Chest Portable 1 View  Result Date: 09/19/2016 CLINICAL DATA:  Woke up at 1 a.[name]. vomiting blood, back bruising and laceration. Daily alcohol intake. EXAM: PORTABLE CHEST 1 VIEW COMPARISON:  Chest radiograph March 02, 2013 FINDINGS: Cardiomediastinal silhouette is normal. No pleural effusions or focal consolidations. Similar RIGHT faint lung scarring. Trachea projects midline and there is no pneumothorax. Soft tissue planes and included osseous structures are non-suspicious. IMPRESSION: No acute cardiopulmonary process. Electronically Signed   By: Awilda Metro [name].D.   On: 09/19/2016 05:20    Assessment;  Patient is 56 year old Caucasian male who presents with upper  GI bleed. He has been drinking excessive amount of alcohol daily. He had a syncopal episode while he was having hematemesis. Exam part midepigastric tenderness and somewhat from the liver. Lab studies pertinent for thrombocytopenia. Differential diagnoses include esophageal variceal bleed peptic ulcer disease or he could also have Mallory-Weiss tear. Patient is on octreotide and pantoprazole Infusion.  Hgb on admission 11.4 and he has received one unit of PRBCs.  Elevated transaminases with pattern consistent with alcoholic hepatitis.  Recommendations;  Esophagogastroduodenoscopy with therapeutic intention under monitored anesthesia care later today. Patient will need treated to prevent alcohol withdrawal but will leave it up to Dr. Delbert Harness. Hepatitis B surface antigen and hepatitis C virus antibody.   LOS: 0 days   Brett [name]  09/19/2016, 8:31 AM

## 2016-09-19 NOTE — Anesthesia Procedure Notes (Signed)
Procedure Name: MAC Date/Time: 09/19/2016 2:30 PM Performed by: Andree Elk, AMY A Pre-anesthesia Checklist: Patient identified, Emergency Drugs available, Suction available, Patient being monitored and Timeout performed Oxygen Delivery Method: Simple face mask

## 2016-09-19 NOTE — Anesthesia Postprocedure Evaluation (Signed)
Anesthesia Post Note  Patient: Brett [name]  Procedure(s) Performed: Procedure(s) (LRB): ESOPHAGOGASTRODUODENOSCOPY (EGD) WITH PROPOFOL (N/A)  Patient location during evaluation: PACU Anesthesia Type: MAC Level of consciousness: awake and alert and oriented Pain management: pain level controlled Vital Signs Assessment: post-procedure vital signs reviewed and stable Respiratory status: spontaneous breathing Cardiovascular status: stable Postop Assessment: no signs of nausea or vomiting Anesthetic complications: no     Last Vitals:  Vitals:   09/19/16 1415 09/19/16 1430  BP: 136/79   Pulse:    Resp: 19 (!) 41  Temp:      Last Pain:  Vitals:   09/19/16 1433  TempSrc:   PainSc: 6                  ADAMS, AMY A

## 2016-09-19 NOTE — ED Provider Notes (Signed)
[facility]-EM[facility]Y DEPT Provider Note   CSN: [id number] Arrival date & time: 09/19/16  0417     History   Chief Complaint Chief Complaint  Patient presents with  . Hematemesis    HPI Brett [name] is a 56 y.o. male.  Patient presents by EMS from home with frank hematemesis. States he fell asleep on the couch while watching basketball and felt fine. He woke around 1 AM and had to rush to the toilet to vomit. He kneeled down to vomit and the next remembers is waking up at 4 AM with bloody vomit on the floor and surrounding the toilet. He vomited gross blood as well as dark and brown emesis. Patient also sustained a fall apparently and has abrasions to his back and a laceration to his right forearm. Also has abrasion to his scalp. He is not certain what happened. Denies chest pain or shortness of breath. He has epigastric abdominal pain and low back pain. He also has pain in the site of his forearm laceration. Denies any dizziness or lightheadedness. Has been battling "flu symptoms" for 2 weeks. Patient states he has vomited blood previously when he was diagnosed with colitis several years ago. Was never diagnosed with ulcers. He states his stools have not been dark or black. He does drink alcohol 5-6 drinks on a daily basis. He denies excessive NSAID use. Reports he had an endoscopy about 10 years ago that was normal. Does not take any blood thinners.   The history is provided by the patient and the EMS personnel. The history is limited by the condition of the patient.    Past Medical History:  Diagnosis Date  . Colitis     There are no active problems to display for this patient.   Past Surgical History:  Procedure Laterality Date  . KNEE SURGERY    . SURGERY SCROTAL / TESTICULAR         Home Medications    Prior to Admission medications   Not on File    Family History History reviewed. No pertinent family history.  Social History Social History  Substance Use Topics    . Smoking status: Current Every Day Smoker    Packs/day: 1.00    Types: Cigarettes  . Smokeless tobacco: Never Used  . Alcohol use 3.0 oz/week    5 Cans of beer per week     Comment: "6 pack daily"     Allergies   Other and Peanut-containing drug products   Review of Systems Review of Systems  Constitutional: Positive for activity change and appetite change.  Respiratory: Negative for cough, chest tightness and shortness of breath.   Gastrointestinal: Positive for abdominal pain, blood in stool, nausea and vomiting.  Genitourinary: Negative for dysuria and hematuria.  Musculoskeletal: Positive for arthralgias, back pain and myalgias.  Skin: Positive for wound.  Neurological: Positive for dizziness, weakness and light-headedness.   A complete 10 system review of systems was obtained and all systems are negative except as noted in the HPI and PMH.    Physical Exam Updated Vital Signs BP 129/80 (BP Location: Left Arm)   Pulse 106   Temp 97.7 F (36.5 C) (Oral)   Resp 20   SpO2 97%   Physical Exam  Constitutional: He is oriented to person, place, and time. He appears well-developed and well-nourished. No distress.  Ill appearing. Tremulous. Blood across face  HENT:  Head: Normocephalic and atraumatic.  Mouth/Throat: Oropharynx is clear and moist. No oropharyngeal exudate.  Small occipital scalp abrasion Pale conjunctiva  Eyes: Conjunctivae and EOM are normal. Pupils are equal, round, and reactive to light.  Neck: Normal range of motion. Neck supple.  No meningismus.  Cardiovascular: Normal rate, normal heart sounds and intact distal pulses.   No murmur heard. Tachycardia 100s  Pulmonary/Chest: Effort normal and breath sounds normal. No respiratory distress. He exhibits no tenderness.  Abdominal: Soft. There is tenderness. There is guarding. There is no rebound.  Epigastric tenderness with voluntary guarding  Genitourinary:  Genitourinary Comments: Gross melena in  underwear  Musculoskeletal: Normal range of motion. He exhibits tenderness. He exhibits no edema.  Abrasion to midline lumbar spine.   6 cm laceration to R volar forearm to muscle.  FROM fingers, wrist.  Flexion and extension intact.   Neurological: He is alert and oriented to person, place, and time. No cranial nerve deficit. He exhibits normal muscle tone. Coordination normal.  No ataxia on finger to nose bilaterally. No pronator drift. 5/5 strength throughout. CN 2-12 intact.Equal grip strength. Sensation intact.   Skin: Skin is warm. Capillary refill takes less than 2 seconds. There is pallor.  Psychiatric: He has a normal mood and affect. His behavior is normal.  Nursing note and vitals reviewed.    ED Treatments / Results  Labs (all labs ordered are listed, but only abnormal results are displayed) Labs Reviewed  COMPREHENSIVE METABOLIC PANEL - Abnormal; Notable for the following:       Result Value   Glucose, Bld 100 (*)    Creatinine, Ser 0.56 (*)    Calcium 7.6 (*)    Total Protein 6.3 (*)    Albumin 3.2 (*)    AST 133 (*)    ALT 64 (*)    Total Bilirubin 1.4 (*)    All other components within normal limits  CBC WITH DIFFERENTIAL/PLATELET - Abnormal; Notable for the following:    RBC 3.18 (*)    Hemoglobin 11.4 (*)    HCT 32.8 (*)    MCV 103.1 (*)    MCH 35.8 (*)    Platelets 70 (*)    All other components within normal limits  PROTIME-INR - Abnormal; Notable for the following:    Prothrombin Time 16.3 (*)    All other components within normal limits  I-STAT CHEM 8, ED - Abnormal; Notable for the following:    Creatinine, Ser 0.60 (*)    Calcium, Ion 1.03 (*)    Hemoglobin 10.9 (*)    HCT 32.0 (*)    All other components within normal limits  POC OCCULT BLOOD, ED - Abnormal; Notable for the following:    Fecal Occult Bld POSITIVE (*)    All other components within normal limits  I-STAT CG4 LACTIC ACID, ED - Abnormal; Notable for the following:    Lactic Acid,  Venous 4.25 (*)    All other components within normal limits  APTT  LIPASE, BLOOD  TROPONIN I  TYPE AND SCREEN  PREPARE RBC (CROSSMATCH)    EKG  EKG Interpretation  Date/Time:  Wednesday September 19 2016 05:16:06 EST Ventricular Rate:  110 PR Interval:    QRS Duration: 91 QT Interval:  345 QTC Calculation: 467 R Axis:   11 Text Interpretation:  Sinus tachycardia Low voltage, precordial leads Rate faster Confirmed by Manus GunningANCOUR  MD, Dalylah Ramey ([id number]) on 09/19/2016 5:24:20 AM       Radiology Dg Forearm Right  Result Date: 09/19/2016 CLINICAL DATA:  Woke up at 1 a.m. vomiting blood, back bruising  and laceration. Daily alcohol intake. EXAM: RIGHT FOREARM - 2 VIEW COMPARISON:  None. FINDINGS: There is no evidence of fracture or other focal bone lesions. Mid forearm soft tissue swelling and subcutaneous gas. Intravenous catheter projects an antecubital fossa. IMPRESSION: Mid forearm soft tissue swelling/laceration. No acute osseous process. Electronically Signed   By: Awilda Metro M.D.   On: 09/19/2016 05:22   Dg Abd 1 View  Result Date: 09/19/2016 CLINICAL DATA:  Woke up at 1 a.m. vomiting blood, back bruising and laceration. Daily alcohol intake. EXAM: ABDOMEN - 1 VIEW COMPARISON:  None. FINDINGS: The bowel gas pattern is normal. No radio-opaque calculi or other significant radiographic abnormality are seen. IMPRESSION: Negative. Electronically Signed   By: Awilda Metro M.D.   On: 09/19/2016 05:19   Dg Chest Portable 1 View  Result Date: 09/19/2016 CLINICAL DATA:  Woke up at 1 a.m. vomiting blood, back bruising and laceration. Daily alcohol intake. EXAM: PORTABLE CHEST 1 VIEW COMPARISON:  Chest radiograph March 02, 2013 FINDINGS: Cardiomediastinal silhouette is normal. No pleural effusions or focal consolidations. Similar RIGHT faint lung scarring. Trachea projects midline and there is no pneumothorax. Soft tissue planes and included osseous structures are non-suspicious.  IMPRESSION: No acute cardiopulmonary process. Electronically Signed   By: Awilda Metro M.D.   On: 09/19/2016 05:20    Procedures .Marland KitchenLaceration Repair Date/Time: 09/19/2016 8:10 AM Performed by: Glynn Octave Authorized by: Glynn Octave   Consent:    Consent obtained:  Verbal   Consent given by:  Patient Anesthesia (see MAR for exact dosages):    Anesthesia method:  Local infiltration   Local anesthetic:  Lidocaine 2% WITH epi Laceration details:    Location:  Shoulder/arm   Shoulder/arm location:  R lower arm   Length (cm):  6 Repair type:    Repair type:  Intermediate Pre-procedure details:    Preparation:  Patient was prepped and draped in usual sterile fashion and imaging obtained to evaluate for foreign bodies Exploration:    Hemostasis achieved with:  Epinephrine   Wound exploration: wound explored through full range of motion and entire depth of wound probed and visualized     Wound extent: fascia violated and muscle damage     Wound extent: no nerve damage noted, no tendon damage noted and no vascular damage noted     Contaminated: no   Treatment:    Area cleansed with:  Betadine   Amount of cleaning:  Standard   Irrigation solution:  Sterile saline   Irrigation method:  Syringe   Visualized foreign bodies/material removed: no   Skin repair:    Repair method:  Sutures   Suture size:  4-0   Suture material:  Prolene   Suture technique:  Simple interrupted   Number of sutures:  7 Approximation:    Approximation:  Close   Vermilion border: well-aligned   Post-procedure details:    Dressing:  Sterile dressing   Patient tolerance of procedure:  Tolerated well, no immediate complications   (including critical care time)  Medications Ordered in ED Medications  sodium chloride 0.9 % bolus 1,000 mL (not administered)    And  0.9 %  sodium chloride infusion (not administered)  pantoprazole (PROTONIX) 80 mg in sodium chloride 0.9 % 100 mL IVPB (not  administered)  pantoprazole (PROTONIX) 80 mg in sodium chloride 0.9 % 250 mL (0.32 mg/mL) infusion (not administered)  ondansetron (ZOFRAN) injection 4 mg (not administered)  octreotide (SANDOSTATIN) 2 mcg/mL load via infusion 50 mcg (not administered)  And  octreotide (SANDOSTATIN) 500 mcg in sodium chloride 0.9 % 250 mL (2 mcg/mL) infusion (not administered)     Initial Impression / Assessment and Plan / ED Course  I have reviewed the triage vital signs and the nursing notes.  Pertinent labs & imaging results that were available during my care of the patient were reviewed by me and considered in my medical decision making (see chart for details).     Apparent upper GI bleed in patient who abuses alcohol.  No diagnosis of ulcers or varices.  BP and mental status adequate on arrival.   Large bore IVs, labs, type and screen. Start protonix gtt and octreotide gtt. Abdomen soft without peritoneal signs.  Patient does have frank melena on exam as well. Hemoglobin is 11 down from 14 three years ago. INR 1.3. Platelet 70.  Patient typed crossed for 2 units and will be transfused. Expect hemoglobin to drop further. Blood bank asked to stay 2 units ahead.  Blood pressure and mental status remained stable in the ED. Discussed with Dr. Jena Gauss of gastroenterology who will evaluate patient and recommends ICU admission. Feels patient can stay at [facility] at this time. He agrees with blood transfusion. Does not recommend NG tube due to probable varices.  Admission d/w Dr. Randol Kern.  BP and mental status remained stable in the ED.  Protonix, octreotide, pRBCs infusing.  Dr. Karilyn Cota at bedside.  CRITICAL CARE Performed by: Glynn Octave Total critical care time: 60 minutes Critical care time was exclusive of separately billable procedures and treating other patients. Critical care was necessary to treat or prevent imminent or life-threatening deterioration. Critical care was time spent  personally by me on the following activities: development of treatment plan with patient and/or surrogate as well as nursing, discussions with consultants, evaluation of patient's response to treatment, examination of patient, obtaining history from patient or surrogate, ordering and performing treatments and interventions, ordering and review of laboratory studies, ordering and review of radiographic studies, pulse oximetry and re-evaluation of patient's condition.   Final Clinical Impressions(s) / ED Diagnoses   Final diagnoses:  Gastrointestinal hemorrhage with hematemesis    New Prescriptions New Prescriptions   No medications on file     Glynn Octave, MD 09/19/16 [phone]

## 2016-09-19 NOTE — Progress Notes (Signed)
1550 Patient returned from endo A&O, no c/o pain or discomfort noted at this time. Vss.

## 2016-09-19 NOTE — Anesthesia Preprocedure Evaluation (Signed)
Anesthesia Evaluation  Patient identified by MRN, date of birth, ID band Patient awake    Reviewed: Allergy & Precautions, NPO status , Patient's Chart, lab work & pertinent test results  Airway Mallampati: II  TM Distance: >3 FB     Dental  (+) Poor Dentition, Dental Advisory Given   Pulmonary Current Smoker,    breath sounds clear to auscultation       Cardiovascular negative cardio ROS   Rhythm:Regular Rate:Normal     Neuro/Psych    GI/Hepatic (+)     substance abuse (admits to 8 beers QD)  alcohol use, Suspect alcoholic liver disease UGI bleed    Endo/Other    Renal/GU      Musculoskeletal   Abdominal   Peds  Hematology   Anesthesia Other Findings   Reproductive/Obstetrics                             Anesthesia Physical Anesthesia Plan  ASA: III  Anesthesia Plan: MAC   Post-op Pain Management:    Induction: Intravenous  Airway Management Planned: Simple Face Mask  Additional Equipment:   Intra-op Plan:   Post-operative Plan:   Informed Consent: I have reviewed the patients History and Physical, chart, labs and discussed the procedure including the risks, benefits and alternatives for the proposed anesthesia with the patient or authorized representative who has indicated his/her understanding and acceptance.     Plan Discussed with:   Anesthesia Plan Comments:         Anesthesia Quick Evaluation

## 2016-09-19 NOTE — H&P (Signed)
[facility] H&P   Patient Demographics:    Brett [name], is a 56 y.o. male  MRN: [id number]   DOB - 09-Jan-1961  Admit Date - 09/19/2016  Outpatient Primary MD for the patient is [name],[name], MD  Referring MD/NP/PA: Dr Manus Gunningancour  Patient coming from: Home  Chief Complaint  Patient presents with  . Hematemesis      HPI:    Brett [name]  is a 56 y.o. male, With significant past medical history of alcohol and tobacco abuse, patient presents with hematemesis, or stool, but 1 AM, went to the toilet to vomit, he remembers significant amount of coffee-ground emesis, then he remembers walking up on the floor couple hours after that, he does not remember exactly how he fell, but he does report loss of consciousness, patient sustained abrasion in the back, and laceration in the right forearm from the fall, he does report epigastric abdominal pain, lower back pain from the fall, denies any chest pain, shortness of breath, dizziness or lightheadedness, he was noticed to have melena on rectal exam by ED physician, patient reports drinking up to 8 beers on a daily basis, he denies any excessive NSAIDs use, hemoglobin found to be 10.9, baseline is 14-15 range in 2015, was ordered 2 units PRBC transfusion and I was called to admit.    Review of systems:    In addition to the HPI above, No Fever-chills, No Headache, No changes with Vision or hearing,Had syncope No problems swallowing food or Liquids, No Chest pain, Cough or Shortness of Breath, No Abdominal pain, No Nausea , Bowel movements are regular, had melena. No blood in urine, but he presents with hematemesis, as well noticed to have melena No dysuria, No new skin rashes or bruises, No new joints pains-aches,  No new weakness, tingling, numbness in any extremity, No recent weight gain or loss, No polyuria, polydypsia or polyphagia, No  significant Mental Stressors.  A full 10 point Review of Systems was done, except as stated above, all other Review of Systems were negative.   With Past History of the following :    Past Medical History:  Diagnosis Date  . Colitis       Past Surgical History:  Procedure Laterality Date  . KNEE SURGERY    . SURGERY SCROTAL / TESTICULAR        Social History:     Social History  Substance Use Topics  . Smoking status: Current Every Day Smoker    Packs/day: 1.00    Types: Cigarettes  . Smokeless tobacco: Never Used  . Alcohol use 3.0 oz/week    5 Cans of beer per week     Comment: "6 pack daily"     Lives - At home  Mobility - independent    Family History :    History reviewed. No pertinent family history.    Home Medications:  Prior to Admission medications   Medication Sig Start Date End Date Taking? Authorizing Provider  ibuprofen (ADVIL,MOTRIN) 800 MG tablet Take 800 mg by mouth every 8 (eight) hours as needed.   Yes Historical Provider, MD     Allergies:     Allergies  Allergen Reactions  . Other     Fruits cause oral swelling  . Peanut-Containing Drug Products      Physical Exam:   Vitals  Blood pressure 118/72, pulse 98, temperature 99.4 F (37.4 C), temperature source Oral, resp. rate (!) 24, height 5\' 10"  (1.778 m), weight 61.9 kg (136 lb 7.4 oz), SpO2 97 %.   1. General Well-developed male lying in bed in NAD,    2. Normal affect and insight, Not Suicidal or Homicidal, Awake Alert, Oriented X 3.  3. No F.N deficits, ALL C.Nerves Intact, Strength 5/5 all 4 extremities, Sensation intact all 4 extremities, Plantars down going.  4. Ears and Eyes appear Normal, Conjunctivae clear, PERRLA. Moist Oral Mucosa.  5. Supple Neck, No JVD, No cervical lymphadenopathy appriciated, No Carotid Bruits.  6. Symmetrical Chest wall movement, Good air movement bilaterally, CTAB.  7. RRR, No Gallops, Rubs or Murmurs, No Parasternal Heave.  8.  Positive Bowel Sounds, Abdomen Soft, No tenderness, No organomegaly appriciated,No rebound -guarding or rigidity.  9.  No Cyanosis, Normal Skin Turgor, Right arm laceration from wound, as well lower back scratch from wound.  10. Good muscle tone,  joints appear normal , no effusions, Normal ROM.  11. No Palpable Lymph Nodes in Neck or Axillae    Data Review:    CBC  Recent Labs Lab 09/19/16 0445 09/19/16 0527  WBC 5.9  --   HGB 11.4* 10.9*  HCT 32.8* 32.0*  PLT 70*  --   MCV 103.1*  --   MCH 35.8*  --   MCHC 34.8  --   RDW 14.1  --   LYMPHSABS 2.3  --   MONOABS 0.6  --   EOSABS 0.2  --   BASOSABS 0.0  --    ------------------------------------------------------------------------------------------------------------------  Chemistries   Recent Labs Lab 09/19/16 0445 09/19/16 0527  NA 135 140  K 3.9 3.8  CL 103 102  CO2 22  --   GLUCOSE 100* 94  BUN 10 9  CREATININE 0.56* 0.60*  CALCIUM 7.6*  --   AST 133*  --   ALT 64*  --   ALKPHOS 86  --   BILITOT 1.4*  --    ------------------------------------------------------------------------------------------------------------------ estimated creatinine clearance is 93.5 mL/min (by C-G formula based on SCr of 0.6 mg/dL (L)). ------------------------------------------------------------------------------------------------------------------ No results for input(s): TSH, T4TOTAL, T3FREE, THYROIDAB in the last 72 hours.  Invalid input(s): FREET3  Coagulation profile  Recent Labs Lab 09/19/16 0445  INR 1.30   ------------------------------------------------------------------------------------------------------------------- No results for input(s): DDIMER in the last 72 hours. -------------------------------------------------------------------------------------------------------------------  Cardiac Enzymes  Recent Labs Lab 09/19/16 0445  TROPONINI <0.03    ------------------------------------------------------------------------------------------------------------------ No results found for: BNP   ---------------------------------------------------------------------------------------------------------------  Urinalysis    Component Value Date/Time   COLORURINE YELLOW 11/06/2013 1414   APPEARANCEUR CLEAR 11/06/2013 1414   LABSPEC <1.005 (L) 11/06/2013 1414   PHURINE 6.0 11/06/2013 1414   GLUCOSEU NEGATIVE 11/06/2013 1414   HGBUR NEGATIVE 11/06/2013 1414   BILIRUBINUR NEGATIVE 11/06/2013 1414   KETONESUR NEGATIVE 11/06/2013 1414   PROTEINUR NEGATIVE 11/06/2013 1414   UROBILINOGEN 0.2 11/06/2013 1414   NITRITE NEGATIVE 11/06/2013 1414   LEUKOCYTESUR NEGATIVE 11/06/2013 1414    ----------------------------------------------------------------------------------------------------------------  Imaging Results:    Dg Forearm Right  Result Date: 09/19/2016 CLINICAL DATA:  Woke up at 1 a.m. vomiting blood, back bruising and laceration. Daily alcohol intake. EXAM: RIGHT FOREARM - 2 VIEW COMPARISON:  None. FINDINGS: There is no evidence of fracture or other focal bone lesions. Mid forearm soft tissue swelling and subcutaneous gas. Intravenous catheter projects an antecubital fossa. IMPRESSION: Mid forearm soft tissue swelling/laceration. No acute osseous process. Electronically Signed   By: Awilda Metro M.D.   On: 09/19/2016 05:22   Dg Abd 1 View  Result Date: 09/19/2016 CLINICAL DATA:  Woke up at 1 a.m. vomiting blood, back bruising and laceration. Daily alcohol intake. EXAM: ABDOMEN - 1 VIEW COMPARISON:  None. FINDINGS: The bowel gas pattern is normal. No radio-opaque calculi or other significant radiographic abnormality are seen. IMPRESSION: Negative. Electronically Signed   By: Awilda Metro M.D.   On: 09/19/2016 05:19   Dg Chest Portable 1 View  Result Date: 09/19/2016 CLINICAL DATA:  Woke up at 1 a.m. vomiting blood, back  bruising and laceration. Daily alcohol intake. EXAM: PORTABLE CHEST 1 VIEW COMPARISON:  Chest radiograph March 02, 2013 FINDINGS: Cardiomediastinal silhouette is normal. No pleural effusions or focal consolidations. Similar RIGHT faint lung scarring. Trachea projects midline and there is no pneumothorax. Soft tissue planes and included osseous structures are non-suspicious. IMPRESSION: No acute cardiopulmonary process. Electronically Signed   By: Awilda Metro M.D.   On: 09/19/2016 05:20      Assessment & Plan:    Active Problems:   GI bleed   Alcohol abuse   Thrombocytopenia (HCC)  Upper GI bleed - Patient presents with hematemesis, most likely upper GI bleed, known history of her disease as evident of severe fatty infiltration on 2015, so may be related to varices versus gastritis/ulcer. - GI consult greatly appreciated, plan for endoscopy today, meanwhile continue with Protonix and octreotide drip. Keep nothing by mouth, continue with IV fluids  Acute blood loss anemia - This is most likely related to upper GI bleed, given patient syncope and orthostasis on presentation, he is receiving 2 units PRBC, monitor H&H closely and transfuse as needed. - Significant microcytosis, with MCV of 103, as well is a thick anemia secondary to alcohol abuse is contributing.  Thrombocytopenia - Chronic, most likely due to alcohol toxicity and liver disease, continue to monitor closely.  Alcohol abuse - We'll start on CIWA protocol, he was counseled  Tobacco abuse - He was counseled  Syncope - Most likely due to orthostasis from significant GI bleed, as well can be vasovagal from hematemesis, check orthostatics as when stable.  DVT Prophylaxis  SCDs  AM Labs Ordered, also please review Full Orders  Family Communication: Admission, patients condition and plan of care including tests being ordered have been discussed with the patient who indicate understanding and agree with the plan and Code  Status.  Code Status Full  Likely DC to  Home  Condition GUARDED    Consults called: GI Dr Karilyn Cota   Admission status: inpatient  Time spent in minutes : 65 minutes   Genia Perin M.D on 09/19/2016 at 11:30 AM  Between 7am to 7pm - Pager - [phone]. After 7pm go to [email or web address] - password [facility]  [facility] - Office  [phone]

## 2016-09-19 NOTE — ED Triage Notes (Signed)
Pt brought in by [facility] for c/o vomiting blood; pt woke up about 1am to vomit and he kneeled down to vomit and when he woke up at 4am he was lying in the floor with vomit on floor and in toilet; pt has bruise down midline of backpt has laceration to right forearm; pt c/o abdominal pain; pt admits to drinking alcohol on a daily basis

## 2016-09-20 ENCOUNTER — Inpatient Hospital Stay (HOSPITAL_COMMUNITY): Payer: Self-pay

## 2016-09-20 DIAGNOSIS — K921 Melena: Secondary | ICD-10-CM

## 2016-09-20 DIAGNOSIS — K2210 Ulcer of esophagus without bleeding: Secondary | ICD-10-CM

## 2016-09-20 DIAGNOSIS — K269 Duodenal ulcer, unspecified as acute or chronic, without hemorrhage or perforation: Secondary | ICD-10-CM

## 2016-09-20 DIAGNOSIS — I8500 Esophageal varices without bleeding: Secondary | ICD-10-CM

## 2016-09-20 LAB — COMPREHENSIVE METABOLIC PANEL
ALT: 53 U/L (ref 17–63)
ANION GAP: 11 (ref 5–15)
AST: 80 U/L — ABNORMAL HIGH (ref 15–41)
Albumin: 3.5 g/dL (ref 3.5–5.0)
Alkaline Phosphatase: 68 U/L (ref 38–126)
BUN: 11 mg/dL (ref 6–20)
CHLORIDE: 97 mmol/L — AB (ref 101–111)
CO2: 24 mmol/L (ref 22–32)
Calcium: 8.2 mg/dL — ABNORMAL LOW (ref 8.9–10.3)
Creatinine, Ser: 0.52 mg/dL — ABNORMAL LOW (ref 0.61–1.24)
GFR calc non Af Amer: >60 mL/min (ref >60)
Glucose, Bld: 115 mg/dL — ABNORMAL HIGH (ref 65–99)
Potassium: 3.4 mmol/L — ABNORMAL LOW (ref 3.5–5.1)
SODIUM: 132 mmol/L — AB (ref 135–145)
Total Bilirubin: 2.5 mg/dL — ABNORMAL HIGH (ref 0.3–1.2)
Total Protein: 6.6 g/dL (ref 6.5–8.1)

## 2016-09-20 LAB — TYPE AND SCREEN
ABO/RH(D): O POS
ANTIBODY SCREEN: NEGATIVE
Unit division: 0
Unit division: 0

## 2016-09-20 LAB — BPAM RBC
BLOOD PRODUCT EXPIRATION DATE: 201803282359
Blood Product Expiration Date: 201803282359
ISSUE DATE / TIME: 201802280618
ISSUE DATE / TIME: 201802281124
UNIT TYPE AND RH: 5100
Unit Type and Rh: 5100

## 2016-09-20 LAB — PROTIME-INR
INR: 1.26
Prothrombin Time: 15.9 seconds — ABNORMAL HIGH (ref 11.4–15.2)

## 2016-09-20 LAB — HEMOGLOBIN AND HEMATOCRIT, BLOOD
HCT: 34.1 % — ABNORMAL LOW (ref 39.0–52.0)
HEMATOCRIT: 34.6 % — AB (ref 39.0–52.0)
HEMOGLOBIN: 12.1 g/dL — AB (ref 13.0–17.0)
Hemoglobin: 12.2 g/dL — ABNORMAL LOW (ref 13.0–17.0)

## 2016-09-20 LAB — HIV ANTIBODY (ROUTINE TESTING W REFLEX): HIV Screen 4th Generation wRfx: NONREACTIVE

## 2016-09-20 MED ORDER — OXYCODONE HCL 5 MG PO TABS
5.0000 mg | ORAL_TABLET | ORAL | Status: DC | PRN
  Administered 2016-09-20: 5 mg via ORAL
  Filled 2016-09-20: qty 1

## 2016-09-20 NOTE — Progress Notes (Signed)
Advanced to heart healthy diet today, tolerating solid food well at this time. Denies any c/o nausea and/or vomiting. No bleeding noted at this time.

## 2016-09-20 NOTE — Progress Notes (Addendum)
Patient ID: Brett [name], male   DOB: 30-Jun-1961, 56 y.o.   MRN: [id number]  Alert. Tolerating clear liquids. No nausea or vomiting during the night. C/o of a lot of flatus and epigastric pain. No BM during the night. Hemoglobin remains stable.  Underwent an EGD yesterday which revealed Grade 3 esophageal varices. Incompletely eradicated. Banded. Non-bleeding esophageal ulcer, portal hypertensive gastropathy. Non bleeding duodenal ulcers with no stigmata bleeding.  Blood pressure (!) 143/75, pulse (!) 106, temperature 98.9 F (37.2 C), temperature source Oral, resp. rate (!) 21, height 5' 10"  (1.778 m), weight 136 lb (61.7 kg), SpO2 96 %. Abdomen soft, Tenderness to epigastric region.  BS are positive  CBC Latest Ref Rng & Units 09/20/2016 09/19/2016 09/19/2016  WBC 4.0 - 10.5 K/uL - 5.6 -  Hemoglobin 13.0 - 17.0 g/dL 12.2(L) 12.8(L) 10.9(L)  Hematocrit 39.0 - 52.0 % 34.6(L) 37.0(L) 32.0(L)  Platelets 150 - 400 K/uL - 49(L) -   Hepatic Function Latest Ref Rng & Units 09/20/2016 09/19/2016 11/06/2013  Total Protein 6.5 - 8.1 g/dL 6.6 6.3(L) 8.4(H)  Albumin 3.5 - 5.0 g/dL 3.5 3.2(L) 3.8  AST 15 - 41 U/L 80(H) 133(H) 157(H)  ALT 17 - 63 U/L 53 64(H) 88(H)  Alk Phosphatase 38 - 126 U/L 68 86 118(H)  Total Bilirubin 0.3 - 1.2 mg/dL 2.5(H) 1.4(H) 1.3(H)  Bilirubin, Direct 0.0 - 0.3 mg/dL - - -    Assessment; Esophageal varices. Scheduled for an US abdomen this am. Continue present medications. Continue clear liquids.  Marland Kitchen

## 2016-09-20 NOTE — Addendum Note (Signed)
Addendum  created 09/20/16 0902 by Franco Noneseresa S Mixtli Reno, CRNA   Sign clinical note

## 2016-09-20 NOTE — Progress Notes (Signed)
Patient's diet was progressed throughout healthy diet cardiac today and he is able to swallow without any difficulty. He complains of back pain which he has a history of asthma and is right upper quadrant abdominal pain. He has not had a bowel movement in the last 24 hours. Hemoglobin 12.2 g. Patient has received 2 units of PRBCs. Ultrasound reveals a regular agreement echogenic liver but no evidence of ascites or splenomegaly. Mild thickening gallbladder wall but no evidence of gallstones.  Other Lab studies noted  Assessment:  Esophageal variceal bleed. Status post esophageal variceal banding yesterday. Patient is doing well. 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 Reflux esophagitis and duodenal ulcer. H. pylori serology hepatitis B surface antigen and HIV antibody are pending. Consider low-dose propranolol and blood pressure stable. We will transition to oral meds in am.

## 2016-09-20 NOTE — Anesthesia Postprocedure Evaluation (Signed)
Anesthesia Post Note  Patient: Brett [name]  Procedure(s) Performed: Procedure(s) (LRB): ESOPHAGOGASTRODUODENOSCOPY (EGD) WITH PROPOFOL (N/A)  Patient location during evaluation: ICU Anesthesia Type: MAC Pain management: satisfactory to patient Vital Signs Assessment: post-procedure vital signs reviewed and stable Respiratory status: spontaneous breathing Cardiovascular status: stable Anesthetic complications: no     Last Vitals:  Vitals:   09/20/16 0700 09/20/16 0800  BP: 134/84 (!) 143/75  Pulse: 95 (!) 106  Resp:    Temp:      Last Pain:  Vitals:   09/20/16 0824  TempSrc:   PainSc: 3                  [name]

## 2016-09-20 NOTE — Progress Notes (Signed)
Appreciate events and notes of consultants. Hemoglobin is stable 4 columns of esophageal varices noted portal hypertension ethanol withdrawal syndrome patient appears hemodynamically stable at present Scherrie NovemberDavid J Mustapha ZOX:[id number] DOB: 1960-12-08 DOA: 09/19/2016 PCP: [name],Kleber Crean M, MD   Physical Exam: Blood pressure 137/77, pulse 90, temperature 98.9 F (37.2 C), temperature source Oral, resp. rate (!) 23, height 5\' 10"  (1.778 m), weight 61.7 kg (136 lb), SpO2 95 %. Lungs diminished breath sounds in the bases no rales wheeze rhonchi appreciable heart regular rhythm no S3-S4 no heaves thrills rubs   Investigations:  Recent Results (from the past 240 hour(s))  MRSA PCR Screening     Status: None   Collection Time: 09/19/16 11:18 AM  Result Value Ref Range Status   MRSA by PCR NEGATIVE NEGATIVE Final    Comment:        The GeneXpert MRSA Assay (FDA approved for NASAL specimens only), is one component of a comprehensive MRSA colonization surveillance program. It is not intended to diagnose MRSA infection nor to guide or monitor treatment for MRSA infections.   Respiratory Panel by PCR     Status: None   Collection Time: 09/19/16 11:18 AM  Result Value Ref Range Status   Adenovirus NOT DETECTED NOT DETECTED Final   Coronavirus 229E NOT DETECTED NOT DETECTED Final   Coronavirus HKU1 NOT DETECTED NOT DETECTED Final   Coronavirus NL63 NOT DETECTED NOT DETECTED Final   Coronavirus OC43 NOT DETECTED NOT DETECTED Final   Metapneumovirus NOT DETECTED NOT DETECTED Final   Rhinovirus / Enterovirus NOT DETECTED NOT DETECTED Final   Influenza A NOT DETECTED NOT DETECTED Final   Influenza B NOT DETECTED NOT DETECTED Final   Parainfluenza Virus 1 NOT DETECTED NOT DETECTED Final   Parainfluenza Virus 2 NOT DETECTED NOT DETECTED Final   Parainfluenza Virus 3 NOT DETECTED NOT DETECTED Final   Parainfluenza Virus 4 NOT DETECTED NOT DETECTED Final   Respiratory Syncytial Virus NOT DETECTED NOT  DETECTED Final   Bordetella pertussis NOT DETECTED NOT DETECTED Final   Chlamydophila pneumoniae NOT DETECTED NOT DETECTED Final   Mycoplasma pneumoniae NOT DETECTED NOT DETECTED Final    Comment: Performed at [facility], [address] [place], [place] [address]     Basic Metabolic Panel:  Recent Labs  47/82/9502/28/18 0445 09/19/16 0527 09/20/16 0430  NA 135 140 132*  K 3.9 3.8 3.4*  CL 103 102 97*  CO2 22  --  24  GLUCOSE 100* 94 115*  BUN 10 9 11   CREATININE 0.56* 0.60* 0.52*  CALCIUM 7.6*  --  8.2*   Liver Function Tests:  Recent Labs  09/19/16 0445 09/20/16 0430  AST 133* 80*  ALT 64* 53  ALKPHOS 86 68  BILITOT 1.4* 2.5*  PROT 6.3* 6.6  ALBUMIN 3.2* 3.5     CBC:  Recent Labs  09/19/16 0445 09/19/16 0527 09/19/16 1604  WBC 5.9  --  5.6  NEUTROABS 2.8  --   --   HGB 11.4* 10.9* 12.8*  HCT 32.8* 32.0* 37.0*  MCV 103.1*  --  98.4  PLT 70*  --  49*    Dg Forearm Right  Result Date: 09/19/2016 CLINICAL DATA:  Woke up at 1 a.m. vomiting blood, back bruising and laceration. Daily alcohol intake. EXAM: RIGHT FOREARM - 2 VIEW COMPARISON:  None. FINDINGS: There is no evidence of fracture or other focal bone lesions. Mid forearm soft tissue swelling and subcutaneous gas. Intravenous catheter projects an antecubital fossa. IMPRESSION: Mid  forearm soft tissue swelling/laceration. No acute osseous process. Electronically Signed   By: Awilda Metro M.D.   On: 09/19/2016 05:22   Dg Abd 1 View  Result Date: 09/19/2016 CLINICAL DATA:  Woke up at 1 a.m. vomiting blood, back bruising and laceration. Daily alcohol intake. EXAM: ABDOMEN - 1 VIEW COMPARISON:  None. FINDINGS: The bowel gas pattern is normal. No radio-opaque calculi or other significant radiographic abnormality are seen. IMPRESSION: Negative. Electronically Signed   By: Awilda Metro M.D.   On: 09/19/2016 05:19   Dg Chest Portable 1 View  Result Date: 09/19/2016 CLINICAL DATA:  Woke up at 1 a.m.  vomiting blood, back bruising and laceration. Daily alcohol intake. EXAM: PORTABLE CHEST 1 VIEW COMPARISON:  Chest radiograph March 02, 2013 FINDINGS: Cardiomediastinal silhouette is normal. No pleural effusions or focal consolidations. Similar RIGHT faint lung scarring. Trachea projects midline and there is no pneumothorax. Soft tissue planes and included osseous structures are non-suspicious. IMPRESSION: No acute cardiopulmonary process. Electronically Signed   By: Awilda Metro M.D.   On: 09/19/2016 05:20      Medications:   Impression:  Active Problems:   GI bleed   Alcohol abuse   Thrombocytopenia (HCC)     Plan: Continue Ativan protocol monitor electrolytes as well as serial hemoglobin and hematocrits clear liquid diet today  Consultants: Gastroenterology   Procedures EGD revealing esophageal varices and portal hypertension   Antibiotics: Rocephin          Time spent: Dirty minutes   LOS: 1 day   Nickolette Espinola M   09/20/2016, 6:29 AM

## 2016-09-20 NOTE — Care Management Note (Addendum)
Case Management Note  Patient Details  Name: Brett [name] MRN: [id number] Date of Birth: 04/30/1961  Subjective/Objective:    Patient adm from home with GI bleed, s/p esophageal varices banding. Ind with ADL's.  [facility] PCP is Dr. Janna Archondiego.    Action/Plan: CM following for needs, anticipate DC home with self care.   Expected Discharge Date:        09/23/2016          Expected Discharge Plan:  Home/Self Care  In-House Referral:  Financial Counselor  Discharge planning Services  CM Consult  Post Acute Care Choice:    Choice offered to:     DME Arranged:    DME Agency:     HH Arranged:    HH Agency:     Status of Service:  In process, will continue to follow  If discussed at Long Length of Stay Meetings, dates discussed:    Additional Comments:  Raekwan Spelman, Chrystine OilerSharley Diane, RN 09/20/2016, 1:49 PM

## 2016-09-20 NOTE — Progress Notes (Signed)
[phone] Called Dr.Rehman regarding advancing patients diet, new order given to start heart healthy diet.

## 2016-09-21 ENCOUNTER — Encounter (HOSPITAL_COMMUNITY): Payer: Self-pay | Admitting: Internal Medicine

## 2016-09-21 LAB — BASIC METABOLIC PANEL
Anion gap: 10 (ref 5–15)
BUN: 9 mg/dL (ref 6–20)
CO2: 24 mmol/L (ref 22–32)
CREATININE: 0.52 mg/dL — AB (ref 0.61–1.24)
Calcium: 8.7 mg/dL — ABNORMAL LOW (ref 8.9–10.3)
Chloride: 102 mmol/L (ref 101–111)
GFR calc non Af Amer: >60 mL/min (ref >60)
Glucose, Bld: 93 mg/dL (ref 65–99)
POTASSIUM: 3.3 mmol/L — AB (ref 3.5–5.1)
Sodium: 136 mmol/L (ref 135–145)

## 2016-09-21 LAB — HEMOGLOBIN AND HEMATOCRIT, BLOOD
HEMATOCRIT: 34.7 % — AB (ref 39.0–52.0)
HEMOGLOBIN: 12.1 g/dL — AB (ref 13.0–17.0)

## 2016-09-21 LAB — HEPATITIS C ANTIBODY: HCV Ab: <0.1 s/co ratio (ref 0.0–0.9)

## 2016-09-21 LAB — H. PYLORI ANTIBODY, IGG: H Pylori IgG: <0.8 Index Value (ref 0.00–0.79)

## 2016-09-21 LAB — HEPATITIS B SURFACE ANTIGEN: HEP B S AG: NEGATIVE

## 2016-09-21 MED ORDER — PANTOPRAZOLE SODIUM 40 MG IV SOLR
40.0000 mg | Freq: Two times a day (BID) | INTRAVENOUS | Status: DC
  Administered 2016-09-21 – 2016-09-22 (×3): 40 mg via INTRAVENOUS
  Filled 2016-09-21 (×3): qty 40

## 2016-09-21 MED ORDER — PROPRANOLOL HCL 20 MG PO TABS
10.0000 mg | ORAL_TABLET | Freq: Two times a day (BID) | ORAL | Status: DC
  Administered 2016-09-21: 10 mg via ORAL
  Administered 2016-09-22: 08:00:00 via ORAL
  Filled 2016-09-21 (×2): qty 1

## 2016-09-21 NOTE — Progress Notes (Signed)
  Subjective:  Patient feels much better today. He does not have pain. He feels like his bowels will move today. He states his appetite is back to normal. He had good breakfast and lunch. He is missing his dog and anxious to go home.  Objective: Blood pressure (!) 99/52, pulse 72, temperature 98.6 F (37 C), temperature source Oral, resp. rate 20, height 5\' 10"  (1.778 m), weight 136 lb (61.7 kg), SpO2 98 %. Patient is alert and in no acute distress. He does not have tremors or asterixis. Abdomen is soft and nontender. Liver edge is firm. Spleen is not palpable.  Labs/studies Results:   Recent Labs  09/19/16 0445  09/19/16 1604 09/20/16 0430 09/20/16 1853 09/21/16 0427  WBC 5.9  --  5.6  --   --   --   HGB 11.4*  < > 12.8* 12.2* 12.1* 12.1*  HCT 32.8*  < > 37.0* 34.6* 34.1* 34.7*  PLT 70*  --  49*  --   --   --   < > = values in this interval not displayed.  BMET   Recent Labs  09/19/16 0445 09/19/16 0527 09/20/16 0430 09/21/16 0427  NA 135 140 132* 136  K 3.9 3.8 3.4* 3.3*  CL 103 102 97* 102  CO2 22  --  24 24  GLUCOSE 100* 94 115* 93  BUN 10 9 11 9   CREATININE 0.56* 0.60* 0.52* 0.52*  CALCIUM 7.6*  --  8.2* 8.7*    LFT   Recent Labs  09/19/16 0445 09/20/16 0430  PROT 6.3* 6.6  ALBUMIN 3.2* 3.5  AST 133* 80*  ALT 64* 53  ALKPHOS 86 68  BILITOT 1.4* 2.5*    PT/INR   Recent Labs  09/19/16 0445 09/20/16 0430  LABPROT 16.3* 15.9*  INR 1.30 1.26    Hepatitis Panel   Recent Labs  09/20/16 0430  HEPBSAG Negative  HCVAB <0.1    H. pylori serology is negative.  Assessment:  #1. Esophageal variceal bleed. Patient is status post banding 2 days ago. Octreotide infusion was tapered this morning. He is back on oral PPI. Hemoglobin remained stable. #2. GERD and duodenal ulcers. H. pylori serology is negative. He will continue pantoprazole twice a day for 8 weeks. 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 It remains to be seen if his portal hypertension will resolve without further injury due to alcohol. Patient is aware that he cannot go back to drinking alcohol. He has a very supportive family which should help.   Recommendations:  Propranolol 10 mg by mouth twice a day. Continue pantoprazole 40 mg by mouth twice a day. CBC and LFTs in a.m. Patient should be able to go home tomorrow morning. Will plan to see him in the office in 4 weeks and arrange for another EGD.

## 2016-09-21 NOTE — Progress Notes (Signed)
Patient alert and oriented. Vital signs are stable. No complaints of any distress. REsting in bed watching tv. IV patent. Report called to C.Morris, Charity fundraiserN. Patient to be transferred to room 328 via wheelchair.

## 2016-09-21 NOTE — Progress Notes (Signed)
Tolerating heart healthy diet today as well as esophageal variceal banding hemoglobin 12.1 stable status post 2 units of PRBCs hemodynamically stable at present Brett [name] JXB:[id number] DOB: 1960/09/12 DOA: 09/19/2016 PCP: Brett StallingNDIEGO,Brett [name] [name], Brett [name]   Physical Exam: Blood pressure (!) 99/52, pulse 72, temperature 98.6 F (37 C), temperature source Oral, resp. rate 20, height 5\' 10"  (1.778 [name]), weight 61.7 kg (136 lb), SpO2 98 %. Lungs diminished breath sounds in the bases no rales wheeze rhonchi appreciable heart regular rhythm no S3-S4 no heaves thrills rubs abdomen soft bowel sounds normoactive no guarding or rebound no masses no megaly   Investigations:  Recent Results (from the past 240 hour(s))  MRSA PCR Screening     Status: None   Collection Time: 09/19/16 11:18 AM  Result Value Ref Range Status   MRSA by PCR NEGATIVE NEGATIVE Final    Comment:        The GeneXpert MRSA Assay (FDA approved for NASAL specimens only), is one component of a comprehensive MRSA colonization surveillance program. It is not intended to diagnose MRSA infection nor to guide or monitor treatment for MRSA infections.   Respiratory Panel by PCR     Status: None   Collection Time: 09/19/16 11:18 AM  Result Value Ref Range Status   Adenovirus NOT DETECTED NOT DETECTED Final   Coronavirus 229E NOT DETECTED NOT DETECTED Final   Coronavirus HKU1 NOT DETECTED NOT DETECTED Final   Coronavirus NL63 NOT DETECTED NOT DETECTED Final   Coronavirus OC43 NOT DETECTED NOT DETECTED Final   Metapneumovirus NOT DETECTED NOT DETECTED Final   Rhinovirus / Enterovirus NOT DETECTED NOT DETECTED Final   Influenza A NOT DETECTED NOT DETECTED Final   Influenza B NOT DETECTED NOT DETECTED Final   Parainfluenza Virus 1 NOT DETECTED NOT DETECTED Final   Parainfluenza Virus 2 NOT DETECTED NOT DETECTED Final   Parainfluenza Virus 3 NOT DETECTED NOT DETECTED Final   Parainfluenza Virus 4 NOT DETECTED NOT DETECTED Final    Respiratory Syncytial Virus NOT DETECTED NOT DETECTED Final   Bordetella pertussis NOT DETECTED NOT DETECTED Final   Chlamydophila pneumoniae NOT DETECTED NOT DETECTED Final   Mycoplasma pneumoniae NOT DETECTED NOT DETECTED Final    Comment: Performed at [facility], [address] [place], [place] [address]     Basic Metabolic Panel:  Recent Labs  57/84/6901/08/09 0430 09/21/16 0427  NA 132* 136  K 3.4* 3.3*  CL 97* 102  CO2 24 24  GLUCOSE 115* 93  BUN 11 9  CREATININE 0.52* 0.52*  CALCIUM 8.2* 8.7*   Liver Function Tests:  Recent Labs  09/19/16 0445 09/20/16 0430  AST 133* 80*  ALT 64* 53  ALKPHOS 86 68  BILITOT 1.4* 2.5*  PROT 6.3* 6.6  ALBUMIN 3.2* 3.5     CBC:  Recent Labs  09/19/16 0445  09/19/16 1604  09/20/16 1853 09/21/16 0427  WBC 5.9  --  5.6  --   --   --   NEUTROABS 2.8  --   --   --   --   --   HGB 11.4*  < > 12.8*  < > 12.1* 12.1*  HCT 32.8*  < > 37.0*  < > 34.1* 34.7*  MCV 103.1*  --  98.4  --   --   --   PLT 70*  --  49*  --   --   --   < > = values in this interval not displayed.  [place] Abdomen  Complete  Result Date: 09/20/2016 CLINICAL DATA:  Alcoholic hepatitis, hematemesis and syncopal episode last night EXAM: ABDOMEN ULTRASOUND COMPLETE COMPARISON:  Abdominal and pelvic CT scan of November 06, 2013 FINDINGS: Gallbladder: The gallbladder is adequately distended. There is irregular thickening of the wall merit varying between 1 and 4 mm. There is no positive sonographic Murphy's sign. No stones or sludge are observed. Common bile duct: Diameter: 3.2 mm Liver: The hepatic echotexture is increased diffusely. There is no discrete mass. The surface contour is slightly irregular. No intrahepatic ductal dilation is observed. IVC: Normal where visualized. Pancreas: Visualization of the pancreas is quite limited. Only a small amount of the pancreatic body is visible. Spleen: Size and appearance within normal limits. Right Kidney: Length: 12.3 cm.  Echogenicity within normal limits. No mass or hydronephrosis visualized. Left Kidney: Length: 12.8 cm. Echogenicity within normal limits. No mass or hydronephrosis visualized. Abdominal aorta: The abdominal aorta measures 2.7 cm proximally. The mid and distal aorta exhibit no aneurysm. The bifurcation is obscured by bowel gas. Other findings: Nose significant ascites is observed. IMPRESSION: Subjective hepatomegaly. Increased hepatic echotexture limits penetration by the ultrasound beam. No definite mass. Mild surface contour irregularity where visualized. No gallstones. Mild gallbladder wall thickening to 4 mm. No positive sonographic Murphy's sign. Limited visualization of the pancreas, IVC, and abdominal aorta. Normal appearance of the spleen and kidneys. Electronically Signed   By: Brett  Brett [name].D.   On: 09/20/2016 09:40      Medications:   Impression:  Active Problems:   GI bleed   Alcohol abuse   Thrombocytopenia (HCC)     Plan: May transfer to telemetry continue Ativan protocol  Consultants: *Neurology   Procedures status post esophageal banding EGD   Antibiotics: Rocephin          Time spent: 30 minutes   LOS: 2 days   Brett [name]   09/21/2016, 11:55 AM

## 2016-09-22 LAB — BASIC METABOLIC PANEL
Anion gap: 7 (ref 5–15)
BUN: 12 mg/dL (ref 6–20)
CHLORIDE: 103 mmol/L (ref 101–111)
CO2: 26 mmol/L (ref 22–32)
CREATININE: 0.63 mg/dL (ref 0.61–1.24)
Calcium: 8.8 mg/dL — ABNORMAL LOW (ref 8.9–10.3)
GFR calc Af Amer: >60 mL/min (ref >60)
GFR calc non Af Amer: >60 mL/min (ref >60)
Glucose, Bld: 104 mg/dL — ABNORMAL HIGH (ref 65–99)
Potassium: 3.6 mmol/L (ref 3.5–5.1)
Sodium: 136 mmol/L (ref 135–145)

## 2016-09-22 LAB — CBC
HEMATOCRIT: 36 % — AB (ref 39.0–52.0)
Hemoglobin: 12.4 g/dL — ABNORMAL LOW (ref 13.0–17.0)
MCH: 33.9 pg (ref 26.0–34.0)
MCHC: 34.4 g/dL (ref 30.0–36.0)
MCV: 98.4 fL (ref 78.0–100.0)
PLATELETS: 66 10*3/uL — AB (ref 150–400)
RBC: 3.66 MIL/uL — ABNORMAL LOW (ref 4.22–5.81)
RDW: 16.5 % — AB (ref 11.5–15.5)
WBC: 3.8 10*3/uL — AB (ref 4.0–10.5)

## 2016-09-22 LAB — HEPATIC FUNCTION PANEL
ALK PHOS: 106 U/L (ref 38–126)
ALT: 43 U/L (ref 17–63)
AST: 67 U/L — ABNORMAL HIGH (ref 15–41)
Albumin: 3.5 g/dL (ref 3.5–5.0)
BILIRUBIN INDIRECT: 0.9 mg/dL (ref 0.3–0.9)
BILIRUBIN TOTAL: 1.2 mg/dL (ref 0.3–1.2)
Bilirubin, Direct: 0.3 mg/dL (ref 0.1–0.5)
TOTAL PROTEIN: 6.6 g/dL (ref 6.5–8.1)

## 2016-09-22 MED ORDER — SUCRALFATE 1 GM/10ML PO SUSP: 1.0000 g | Freq: Three times a day (TID) | ORAL | 0 refills | Status: DC

## 2016-09-22 MED ORDER — PANTOPRAZOLE SODIUM 40 MG PO TBEC: 40.0000 mg | DELAYED_RELEASE_TABLET | Freq: Every day | ORAL | 3 refills | Status: AC

## 2016-09-22 MED ORDER — PROPRANOLOL HCL 10 MG PO TABS: 10.0000 mg | ORAL_TABLET | Freq: Two times a day (BID) | ORAL | 3 refills | Status: AC

## 2016-09-22 NOTE — Discharge Summary (Signed)
Physician Discharge Summary  [name] ZOX:[id number] DOB: Jul 03, 1961 DOA: 09/19/2016  PCP: [name],[name], MD  Admit date: 09/19/2016 Discharge date: 09/22/2016   Recommendations for Outpatient Follow-up:  Within a week's time as well as follow-up with me within a month's time to assess smoking cessation antacid therapy and beta blocker therapy Discharge Diagnoses:  Active Problems:   GI bleed   Alcohol abuse   Thrombocytopenia [name]   Discharge Condition: Good  Filed Weights   09/19/16 0839 09/19/16 1333  Weight: 61.9 kg (136 lb 7.4 oz) 61.7 kg (136 lb)    History of present illness:  Patient is 56 year old white male with a history of ethanol usage who presented with an upper GI bleed was admitted to ICU received 2 units of packed red blood cells for hemodynamic stabilization he had an EGD which revealed 4 columns of esophageal varices with no other direct evidence of portal hypertension he was shaved with antiacids clear liquid diet he did as well as diet was advanced tolerated this well and was placed on Protonix 40 twice a day as well as propanolol 10 mg by mouth twice a day on hospital was counseled on smoking cessation and different modalities from nicotine patches Chantix patient declined to take either hopefully will follow-up in the office with me in several weeks time  Hospital Course:  See history of present illness above  Procedures:  EGD  Consultations:  Gastroenterology  Discharge Instructions    Allergies  Allergen Reactions  . Other     Fruits cause oral swelling  . Peanut-Containing Drug Products       The results of significant diagnostics from this hospitalization (including imaging, microbiology, ancillary and laboratory) are listed below for reference.    Significant Diagnostic Studies: Dg Forearm Right  Result Date: 09/19/2016 CLINICAL DATA:  Woke up at 1 a.m. vomiting blood, back bruising and laceration. Daily alcohol intake.  EXAM: RIGHT FOREARM - 2 VIEW COMPARISON:  None. FINDINGS: There is no evidence of fracture or other focal bone lesions. Mid forearm soft tissue swelling and subcutaneous gas. Intravenous catheter projects an antecubital fossa. IMPRESSION: Mid forearm soft tissue swelling/laceration. No acute osseous process. Electronically Signed   By: Awilda Metroourtnay  Bloomer M.D.   On: 09/19/2016 05:22   Dg Abd 1 View  Result Date: 09/19/2016 CLINICAL DATA:  Woke up at 1 a.m. vomiting blood, back bruising and laceration. Daily alcohol intake. EXAM: ABDOMEN - 1 VIEW COMPARISON:  None. FINDINGS: The bowel gas pattern is normal. No radio-opaque calculi or other significant radiographic abnormality are seen. IMPRESSION: Negative. Electronically Signed   By: Awilda Metroourtnay  Bloomer M.D.   On: 09/19/2016 05:19   [place] Abdomen Complete  Result Date: 09/20/2016 CLINICAL DATA:  Alcoholic hepatitis, hematemesis and syncopal episode last night EXAM: ABDOMEN ULTRASOUND COMPLETE COMPARISON:  Abdominal and pelvic CT scan of November 06, 2013 FINDINGS: Gallbladder: The gallbladder is adequately distended. There is irregular thickening of the wall merit varying between 1 and 4 mm. There is no positive sonographic Murphy's sign. No stones or sludge are observed. Common bile duct: Diameter: 3.2 mm Liver: The hepatic echotexture is increased diffusely. There is no discrete mass. The surface contour is slightly irregular. No intrahepatic ductal dilation is observed. IVC: Normal where visualized. Pancreas: Visualization of the pancreas is quite limited. Only a small amount of the pancreatic body is visible. Spleen: Size and appearance within normal limits. Right Kidney: Length: 12.3 cm. Echogenicity within normal limits. No mass or hydronephrosis visualized. Left  Kidney: Length: 12.8 cm. Echogenicity within normal limits. No mass or hydronephrosis visualized. Abdominal aorta: The abdominal aorta measures 2.7 cm proximally. The mid and distal aorta exhibit no  aneurysm. The bifurcation is obscured by bowel gas. Other findings: Nose significant ascites is observed. IMPRESSION: Subjective hepatomegaly. Increased hepatic echotexture limits penetration by the ultrasound beam. No definite mass. Mild surface contour irregularity where visualized. No gallstones. Mild gallbladder wall thickening to 4 mm. No positive sonographic Murphy's sign. Limited visualization of the pancreas, IVC, and abdominal aorta. Normal appearance of the spleen and kidneys. Electronically Signed   By: Santino  [place] M.D.   On: 09/20/2016 09:40   Dg Chest Portable 1 View  Result Date: 09/19/2016 CLINICAL DATA:  Woke up at 1 a.m. vomiting blood, back bruising and laceration. Daily alcohol intake. EXAM: PORTABLE CHEST 1 VIEW COMPARISON:  Chest radiograph March 02, 2013 FINDINGS: Cardiomediastinal silhouette is normal. No pleural effusions or focal consolidations. Similar RIGHT faint lung scarring. Trachea projects midline and there is no pneumothorax. Soft tissue planes and included osseous structures are non-suspicious. IMPRESSION: No acute cardiopulmonary process. Electronically Signed   By: Awilda Metro M.D.   On: 09/19/2016 05:20    Microbiology: Recent Results (from the past 240 hour(s))  MRSA PCR Screening     Status: None   Collection Time: 09/19/16 11:18 AM  Result Value Ref Range Status   MRSA by PCR NEGATIVE NEGATIVE Final    Comment:        The GeneXpert MRSA Assay (FDA approved for NASAL specimens only), is one component of a comprehensive MRSA colonization surveillance program. It is not intended to diagnose MRSA infection nor to guide or monitor treatment for MRSA infections.   Respiratory Panel by PCR     Status: None   Collection Time: 09/19/16 11:18 AM  Result Value Ref Range Status   Adenovirus NOT DETECTED NOT DETECTED Final   Coronavirus 229E NOT DETECTED NOT DETECTED Final   Coronavirus HKU1 NOT DETECTED NOT DETECTED Final   Coronavirus NL63 NOT  DETECTED NOT DETECTED Final   Coronavirus OC43 NOT DETECTED NOT DETECTED Final   Metapneumovirus NOT DETECTED NOT DETECTED Final   Rhinovirus / Enterovirus NOT DETECTED NOT DETECTED Final   Influenza A NOT DETECTED NOT DETECTED Final   Influenza B NOT DETECTED NOT DETECTED Final   Parainfluenza Virus 1 NOT DETECTED NOT DETECTED Final   Parainfluenza Virus 2 NOT DETECTED NOT DETECTED Final   Parainfluenza Virus 3 NOT DETECTED NOT DETECTED Final   Parainfluenza Virus 4 NOT DETECTED NOT DETECTED Final   Respiratory Syncytial Virus NOT DETECTED NOT DETECTED Final   Bordetella pertussis NOT DETECTED NOT DETECTED Final   Chlamydophila pneumoniae NOT DETECTED NOT DETECTED Final   Mycoplasma pneumoniae NOT DETECTED NOT DETECTED Final    Comment: Performed at [facility], [address], [place], [place] [address]     Labs: Basic Metabolic Panel:  Recent Labs Lab 09/19/16 0445 09/19/16 0527 09/20/16 0430 09/21/16 0427 09/22/16 0619  NA 135 140 132* 136 136  K 3.9 3.8 3.4* 3.3* 3.6  CL 103 102 97* 102 103  CO2 22  --  24 24 26   GLUCOSE 100* 94 115* 93 104*  BUN 10 9 11 9 12   CREATININE 0.56* 0.60* 0.52* 0.52* 0.63  CALCIUM 7.6*  --  8.2* 8.7* 8.8*   Liver Function Tests:  Recent Labs Lab 09/19/16 0445 09/20/16 0430 09/22/16 0619  AST 133* 80* 67*  ALT 64* 53 43  ALKPHOS 86 68 106  BILITOT 1.4* 2.5* 1.2  PROT 6.3* 6.6 6.6  ALBUMIN 3.2* 3.5 3.5    Recent Labs Lab 09/19/16 0445  LIPASE 35   No results for input(s): AMMONIA in the last 168 hours. CBC:  Recent Labs Lab 09/19/16 0445  09/19/16 1604 09/20/16 0430 09/20/16 1853 09/21/16 0427 09/22/16 0619  WBC 5.9  --  5.6  --   --   --  3.8*  NEUTROABS 2.8  --   --   --   --   --   --   HGB 11.4*  < > 12.8* 12.2* 12.1* 12.1* 12.4*  HCT 32.8*  < > 37.0* 34.6* 34.1* 34.7* 36.0*  MCV 103.1*  --  98.4  --   --   --  98.4  PLT 70*  --  49*  --   --   --  66*  < > = values in this interval not  displayed. Cardiac Enzymes:  Recent Labs Lab 09/19/16 0445  TROPONINI <0.03   BNP: BNP (last 3 results) No results for input(s): BNP in the last 8760 hours.  ProBNP (last 3 results) No results for input(s): PROBNP in the last 8760 hours.  CBG: No results for input(s): GLUCAP in the last 168 hours.     Signed:  Mariany Mackintosh Judie Petit  [facility] Pager: [phone] 09/22/2016, 11:23 AM

## 2016-09-22 NOTE — Progress Notes (Signed)
Discharge instructions and medications reviewed w/pt and his wife.  Pt is able to verbalize instructions given to him by Drs. Rehman and Dondiego, including follow-up appointments.  Prescriptions have been called-in to pharmacy.  Pt has no questions at this time.

## 2016-09-24 ENCOUNTER — Telehealth (INDEPENDENT_AMBULATORY_CARE_PROVIDER_SITE_OTHER): Payer: Self-pay | Admitting: Internal Medicine

## 2016-09-24 NOTE — Telephone Encounter (Signed)
Patient would like to know if he can take Tylenol occasionally for body aches.

## 2016-09-24 NOTE — Telephone Encounter (Signed)
Patient called, stated that he has a quick medication question for Dr. Karilyn Cotaehman.  He hasn't been seen since 2005.  [phone]

## 2016-09-25 NOTE — Telephone Encounter (Signed)
Can take Tylenol on as-needed basis. Should not be daily should not take more than 1 g on a given day.

## 2016-09-26 NOTE — Telephone Encounter (Signed)
Patient was called and voicemail full, called his wife and gave her Dr.Rehman's recommendation.

## 2016-09-27 ENCOUNTER — Telehealth (INDEPENDENT_AMBULATORY_CARE_PROVIDER_SITE_OTHER): Payer: Self-pay | Admitting: *Deleted

## 2016-09-27 NOTE — Telephone Encounter (Signed)
Dr.Rehman ask that Brett [name] be seen in 4 weeks. He also wants him to have lab work prior to this appointment. CBC,C-Met,INR.  Please let me know appointment so that I can arrange the lab work.

## 2016-09-28 NOTE — Telephone Encounter (Signed)
I have sent Brett [name] a message to open a spot for this patient.

## 2016-10-02 ENCOUNTER — Encounter (INDEPENDENT_AMBULATORY_CARE_PROVIDER_SITE_OTHER): Payer: Self-pay | Admitting: Internal Medicine

## 2016-10-02 ENCOUNTER — Telehealth (INDEPENDENT_AMBULATORY_CARE_PROVIDER_SITE_OTHER): Payer: Self-pay | Admitting: Internal Medicine

## 2016-10-02 ENCOUNTER — Other Ambulatory Visit (INDEPENDENT_AMBULATORY_CARE_PROVIDER_SITE_OTHER): Payer: Self-pay | Admitting: *Deleted

## 2016-10-02 DIAGNOSIS — D696 Thrombocytopenia, unspecified: Secondary | ICD-10-CM

## 2016-10-02 DIAGNOSIS — F1010 Alcohol abuse, uncomplicated: Secondary | ICD-10-CM

## 2016-10-02 NOTE — Telephone Encounter (Signed)
Patient's labs are noted for 10/22/2016. Patient will be sent a letter as a reminder.

## 2016-10-02 NOTE — Telephone Encounter (Signed)
Brett [name], this patient has been given an appointment for 10/23/16 at 11:45am with Dr. Karilyn Cota.  A letter has been sent to the patient.  I'm sending this to you so you can schedule his labs.

## 2016-10-15 ENCOUNTER — Encounter (INDEPENDENT_AMBULATORY_CARE_PROVIDER_SITE_OTHER): Payer: Self-pay | Admitting: *Deleted

## 2016-10-15 ENCOUNTER — Other Ambulatory Visit (INDEPENDENT_AMBULATORY_CARE_PROVIDER_SITE_OTHER): Payer: Self-pay | Admitting: *Deleted

## 2016-10-15 DIAGNOSIS — F1010 Alcohol abuse, uncomplicated: Secondary | ICD-10-CM

## 2016-10-15 DIAGNOSIS — D696 Thrombocytopenia, unspecified: Secondary | ICD-10-CM

## 2016-10-18 ENCOUNTER — Encounter (INDEPENDENT_AMBULATORY_CARE_PROVIDER_SITE_OTHER): Payer: Self-pay | Admitting: Internal Medicine

## 2016-10-19 LAB — CBC
HCT: 40.5 % (ref 38.5–50.0)
Hemoglobin: 13.7 g/dL (ref 13.2–17.1)
MCH: 33.7 pg — AB (ref 27.0–33.0)
MCHC: 33.8 g/dL (ref 32.0–36.0)
MCV: 99.8 fL (ref 80.0–100.0)
MPV: 10 fL (ref 7.5–12.5)
Platelets: 98 10*3/uL — ABNORMAL LOW (ref 140–400)
RBC: 4.06 MIL/uL — ABNORMAL LOW (ref 4.20–5.80)
RDW: 15 % (ref 11.0–15.0)
WBC: 4.8 10*3/uL (ref 3.8–10.8)

## 2016-10-20 LAB — PROTIME-INR
INR: 1.1
PROTHROMBIN TIME: 12 s — AB (ref 9.0–11.5)

## 2016-10-23 ENCOUNTER — Ambulatory Visit (INDEPENDENT_AMBULATORY_CARE_PROVIDER_SITE_OTHER): Payer: Self-pay | Admitting: Internal Medicine

## 2016-10-23 ENCOUNTER — Encounter (INDEPENDENT_AMBULATORY_CARE_PROVIDER_SITE_OTHER): Payer: Self-pay | Admitting: Internal Medicine

## 2016-10-23 VITALS — BP 104/70 | HR 67 | Temp 98.1°F | Resp 18 | Ht 70.0 in | Wt 141.0 lb

## 2016-10-23 DIAGNOSIS — K709 Alcoholic liver disease, unspecified: Secondary | ICD-10-CM

## 2016-10-23 DIAGNOSIS — I8501 Esophageal varices with bleeding: Secondary | ICD-10-CM

## 2016-10-23 NOTE — Patient Instructions (Signed)
Esophagogastroduodenoscopy with esophageal variceal banding to be scheduled

## 2016-10-23 NOTE — Progress Notes (Signed)
Presenting complaint;  Follow-up for alcoholic liver disease and esophageal variceal bleed.  Base and Subjective:  Brett [name] is 56 year old Caucasian male who was admitted to [facility] about 5 weeks ago for upper GI bleed. He was also felt to have alcoholic hepatitis and possibly cirrhosis. Ultrasound did not show changes typical of cirrhosis. EGD revealed esophageal variceal bleed and he was banded. He did require 2 units of PRBCs. He is now returning for scheduled visit. He had blood work as planned.  He has no complaints. He has not had any alcohol since last hospitalization. He has good appetite. He has gained few pounds in the last few weeks. He denies heartburn dysphagia nausea or vomiting. He is having 1-2 formed stools daily. He denies melena or rectal bleeding. He continues to smoke cigarettes about a pack a day. He is on pantoprazole and propranolol but he decided not to fill sucralfate because of cost. He says he does not have primary care physician at the present time.   Current Medications: Outpatient Encounter Prescriptions as of 10/23/2016  Medication Sig  . pantoprazole (PROTONIX) 40 MG tablet Take 1 tablet (40 mg total) by mouth daily.  . propranolol (INDERAL) 10 MG tablet Take 1 tablet (10 mg total) by mouth 2 (two) times daily. (Patient not taking: Reported on 10/23/2016)  . sucralfate (CARAFATE) 1 GM/10ML suspension Take 10 mLs (1 g total) by mouth 4 (four) times daily -  with meals and at bedtime. (Patient not taking: Reported on 10/23/2016)   No facility-administered encounter medications on file as of 10/23/2016.      Objective: Blood pressure 104/70, pulse 67, temperature 98.1 F (36.7 C), temperature source Oral, resp. rate 18, height  (1.778 m), weight 141 lb (64 kg). Patient is alert and in no acute distress. Asterixis absent. Conjunctiva is pink. Sclera is nonicteric Oropharyngeal mucosa is normal. No neck masses or thyromegaly noted. Cardiac exam with regular rhythm  normal S1 and S2. No murmur or gallop noted. Lungs are clear to auscultation. Abdomen is flat and soft. Spleen is not palpable. Liver edge is easily palpable below RCM it's somewhat firm but not tender.  No LE edema or clubbing noted.  Labs/studies Results: Data from 10/19/2016  WBC 4.8, H&H 13.7 and 40.5 and platelet count 90 8K.  INR 1.1.   Chemistry panel ordered but not done.   Assessment:  #1. History of esophageal variceal bleed. He underwent banding 5 weeks ago. He required 2 units of PRBCs. H&H has returned to normal. He needs to undergo repeat EGD to eradicate remaining varices. #2. Alcoholic liver disease. He did have elevated transaminases consistent with alcoholic hepatitis. He is also suspected to have alcoholic cirrhosis although ultrasound did not confirm this diagnosis but he does have thrombocytopenia. It is possible that he has central hyaline sclerosis not full-blown cirrhosis. Patient understands that he must not drink alcohol ever again.   Plan:  Sucralfate deleted from distal medications. Continue pantoprazole and propranolol at current dose. Patient will have chemistry panel at the time of EGD. EGD with esophageal variceal banding to be scheduled later this month. Office visit in 3 months.

## 2016-10-24 ENCOUNTER — Encounter (INDEPENDENT_AMBULATORY_CARE_PROVIDER_SITE_OTHER): Payer: Self-pay | Admitting: *Deleted

## 2016-10-24 ENCOUNTER — Other Ambulatory Visit (INDEPENDENT_AMBULATORY_CARE_PROVIDER_SITE_OTHER): Payer: Self-pay | Admitting: Internal Medicine

## 2016-10-24 ENCOUNTER — Encounter (INDEPENDENT_AMBULATORY_CARE_PROVIDER_SITE_OTHER): Payer: Self-pay | Admitting: Internal Medicine

## 2016-10-24 DIAGNOSIS — I8500 Esophageal varices without bleeding: Secondary | ICD-10-CM | POA: Insufficient documentation

## 2016-11-12 NOTE — Patient Instructions (Signed)
Brett [name]  11/12/2016     @   Your procedure is scheduled on  11/23/2016   Report to [facility] at  1215  P.M.  Call this number if you have problems the morning of surgery:  [phone]   Remember:  Do not eat food or drink liquids after midnight.  Take these medicines the morning of surgery with A SIP OF WATER  protonix, propranolol.   Do not wear jewelry, make-up or nail polish.  Do not wear lotions, powders, or perfumes, or deoderant.  Do not shave 48 hours prior to surgery.  Men may shave face and neck.  Do not bring valuables to the hospital.  [facility] is not responsible for any belongings or valuables.  Contacts, dentures or bridgework may not be worn into surgery.  Leave your suitcase in the car.  After surgery it may be brought to your room.  For patients admitted to the hospital, discharge time will be determined by your treatment team.  Patients discharged the day of surgery will not be allowed to drive home.   Name and phone number of your driver:   family Special instructions:  Follow the diet instructions given to you by Dr Patty Sermons office.  Please read over the following fact sheets that you were given. Anesthesia Post-op Instructions and Care and Recovery After Surgery       Esophagogastroduodenoscopy Esophagogastroduodenoscopy (EGD) is a procedure to examine the lining of the esophagus, stomach, and first part of the small intestine (duodenum). This procedure is done to check for problems such as inflammation, bleeding, ulcers, or growths. During this procedure, a long, flexible, lighted tube with a camera attached (endoscope) is inserted down the throat. Tell a health care provider about:  Any allergies you have.  All medicines you are taking, including vitamins, herbs, eye drops, creams, and over-the-counter medicines.  Any problems you or family members have had with anesthetic medicines.  Any blood  disorders you have.  Any surgeries you have had.  Any medical conditions you have.  Whether you are pregnant or may be pregnant. What are the risks? Generally, this is a safe procedure. However, problems may occur, including:  Infection.  Bleeding.  A tear (perforation) in the esophagus, stomach, or duodenum.  Trouble breathing.  Excessive sweating.  Spasms of the larynx.  A slowed heartbeat.  Low blood pressure. What happens before the procedure?  Follow instructions from your health care provider about eating or drinking restrictions.  Ask your health care provider about:  Changing or stopping your regular medicines. This is especially important if you are taking diabetes medicines or blood thinners.  Taking medicines such as aspirin and ibuprofen. These medicines can thin your blood. Do not take these medicines before your procedure if your health care provider instructs you not to.  Plan to have someone take you home after the procedure.  If you wear dentures, be ready to remove them before the procedure. What happens during the procedure?  To reduce your risk of infection, your health care team will wash or sanitize their hands.  An IV tube will be put in a vein in your hand or arm. You will get medicines and fluids through this tube.  You will be given one or more of the following:  A medicine to help you relax (sedative).  A medicine to numb the area (local anesthetic). This medicine may be sprayed into  your throat. It will make you feel more comfortable and keep you from gagging or coughing during the procedure.  A medicine for pain.  A mouth guard may be placed in your mouth to protect your teeth and to keep you from biting on the endoscope.  You will be asked to lie on your left side.  The endoscope will be lowered down your throat into your esophagus, stomach, and duodenum.  Air will be put into the endoscope. This will help your health care  provider see better.  The lining of your esophagus, stomach, and duodenum will be examined.  Your health care provider may:  Take a tissue sample so it can be looked at in a lab (biopsy).  Remove growths.  Remove objects (foreign bodies) that are stuck.  Treat any bleeding with medicines or other devices that stop tissue from bleeding.  Widen (dilate) or stretch narrowed areas of your esophagus and stomach.  The endoscope will be taken out. The procedure may vary among health care providers and hospitals. What happens after the procedure?  Your blood pressure, heart rate, breathing rate, and blood oxygen level will be monitored often until the medicines you were given have worn off.  Do not eat or drink anything until the numbing medicine has worn off and your gag reflex has returned. This information is not intended to replace advice given to you by your health care provider. Make sure you discuss any questions you have with your health care provider. Document Released: 11/09/2004 Document Revised: 12/15/2015 Document Reviewed: 06/02/2015 Elsevier Interactive Patient Education  2017 [employer] Esophagogastroduodenoscopy, Care After Refer to this sheet in the next few weeks. These instructions provide you with information about caring for yourself after your procedure. Your health care provider may also give you more specific instructions. Your treatment has been planned according to current medical practices, but problems sometimes occur. Call your health care provider if you have any problems or questions after your procedure. What can I expect after the procedure? After the procedure, it is common to have:  A sore throat.  Nausea.  Bloating.  Dizziness.  Fatigue. Follow these instructions at home:  Do not eat or drink anything until the numbing medicine (local anesthetic) has worn off and your gag reflex has returned. You will know that the local anesthetic has worn  off when you can swallow comfortably.  Do not drive for 24 hours if you received a medicine to help you relax (sedative).  If your health care provider took a tissue sample for testing during the procedure, make sure to get your test results. This is your responsibility. Ask your health care provider or the department performing the test when your results will be ready.  Keep all follow-up visits as told by your health care provider. This is important. Contact a health care provider if:  You cannot stop coughing.  You are not urinating.  You are urinating less than usual. Get help right away if:  You have trouble swallowing.  You cannot eat or drink.  You have throat or chest pain that gets worse.  You are dizzy or light-headed.  You faint.  You have nausea or vomiting.  You have chills.  You have a fever.  You have severe abdominal pain.  You have black, tarry, or bloody stools. This information is not intended to replace advice given to you by your health care provider. Make sure you discuss any questions you have with your health care provider.  Document Released: 06/25/2012 Document Revised: 12/15/2015 Document Reviewed: 06/02/2015 Elsevier Interactive Patient Education  2017 [employer]  Esophageal Dilatation Esophageal dilatation is a procedure to open a blocked or narrowed part of the esophagus. The esophagus is the long tube in your throat that carries food and liquid from your mouth to your stomach. The procedure is also called esophageal dilation. You may need this procedure if you have a buildup of scar tissue in your esophagus that makes it difficult, painful, or even impossible to swallow. This can be caused by gastroesophageal reflux disease (GERD). In rare cases, people need this procedure because they have cancer of the esophagus or a problem with the way food moves through the esophagus. Sometimes you may need to have another dilatation to enlarge the  opening of the esophagus gradually. Tell a health care provider about:  Any allergies you have.  All medicines you are taking, including vitamins, herbs, eye drops, creams, and over-the-counter medicines.  Any problems you or family members have had with anesthetic medicines.  Any blood disorders you have.  Any surgeries you have had.  Any medical conditions you have.  Any antibiotic medicines you are required to take before dental procedures. What are the risks? Generally, this is a safe procedure. However, problems can occur and include:  Bleeding from a tear in the lining of the esophagus.  A hole (perforation) in the esophagus. What happens before the procedure?  Do not eat or drink anything after midnight on the night before the procedure or as directed by your health care provider.  Ask your health care provider about changing or stopping your regular medicines. This is especially important if you are taking diabetes medicines or blood thinners.  Plan to have someone take you home after the procedure. What happens during the procedure?  You will be given a medicine that makes you relaxed and sleepy (sedative).  A medicine may be sprayed or gargled to numb the back of the throat.  Your health care provider can use various instruments to do an esophageal dilatation. During the procedure, the instrument used will be placed in your mouth and passed down into your esophagus. Options include:  Simple dilators. This instrument is carefully placed in the esophagus to stretch it.  Guided wire bougies. In this method, a flexible tube (endoscope) is used to insert a wire into the esophagus. The dilator is passed over this wire to enlarge the esophagus. Then the wire is removed.  Balloon dilators. An endoscope with a small balloon at the end is passed down into the esophagus. Inflating the balloon gently stretches the esophagus and opens it up. What happens after the  procedure?  Your blood pressure, heart rate, breathing rate, and blood oxygen level will be monitored often until the medicines you were given have worn off.  Your throat may feel slightly sore and will probably still feel numb. This will improve slowly over time.  You will not be allowed to eat or drink until the throat numbness has resolved.  If this is a same-day procedure, you may be allowed to go home once you have been able to drink, urinate, and sit on the edge of the bed without nausea or dizziness.  If this is a same-day procedure, you should have a friend or family member with you for the next 24 hours after the procedure. This information is not intended to replace advice given to you by your health care provider. Make sure you discuss any questions  you have with your health care provider. Document Released: 08/30/2005 Document Revised: 12/15/2015 Document Reviewed: 11/18/2013 Elsevier Interactive Patient Education  2017 [employer]  Monitored Anesthesia Care Anesthesia is a term that refers to techniques, procedures, and medicines that help a person stay safe and comfortable during a medical procedure. Monitored anesthesia care, or sedation, is one type of anesthesia. Your anesthesia specialist may recommend sedation if you will be having a procedure that does not require you to be unconscious, such as:  Cataract surgery.  A dental procedure.  A biopsy.  A colonoscopy. During the procedure, you may receive a medicine to help you relax (sedative). There are three levels of sedation:  Mild sedation. At this level, you may feel awake and relaxed. You will be able to follow directions.  Moderate sedation. At this level, you will be sleepy. You may not remember the procedure.  Deep sedation. At this level, you will be asleep. You will not remember the procedure. The more medicine you are given, the deeper your level of sedation will be. Depending on how you respond to the  procedure, the anesthesia specialist may change your level of sedation or the type of anesthesia to fit your needs. An anesthesia specialist will monitor you closely during the procedure. Let your health care provider know about:  Any allergies you have.  All medicines you are taking, including vitamins, herbs, eye drops, creams, and over-the-counter medicines.  Any use of steroids (by mouth or as a cream).  Any problems you or family members have had with sedatives and anesthetic medicines.  Any blood disorders you have.  Any surgeries you have had.  Any medical conditions you have, such as sleep apnea.  Whether you are pregnant or may be pregnant.  Any use of cigarettes, alcohol, or street drugs. What are the risks? Generally, this is a safe procedure. However, problems may occur, including:  Getting too much medicine (oversedation).  Nausea.  Allergic reaction to medicines.  Trouble breathing. If this happens, a breathing tube may be used to help with breathing. It will be removed when you are awake and breathing on your own.  Heart trouble.  Lung trouble. Before the procedure Staying hydrated  Follow instructions from your health care provider about hydration, which may include:  Up to 2 hours before the procedure - you may continue to drink clear liquids, such as water, clear fruit juice, black coffee, and plain tea. Eating and drinking restrictions  Follow instructions from your health care provider about eating and drinking, which may include:  8 hours before the procedure - stop eating heavy meals or foods such as meat, fried foods, or fatty foods.  6 hours before the procedure - stop eating light meals or foods, such as toast or cereal.  6 hours before the procedure - stop drinking milk or drinks that contain milk.  2 hours before the procedure - stop drinking clear liquids. Medicines  Ask your health care provider about:  Changing or stopping your regular  medicines. This is especially important if you are taking diabetes medicines or blood thinners.  Taking medicines such as aspirin and ibuprofen. These medicines can thin your blood. Do not take these medicines before your procedure if your health care provider instructs you not to. Tests and exams  You will have a physical exam.  You may have blood tests done to show:  How well your kidneys and liver are working.  How well your blood can clot.  General instructions  Plan to have someone take you home from the hospital or clinic.  If you will be going home right after the procedure, plan to have someone with you for 24 hours. What happens during the procedure?  Your blood pressure, heart rate, breathing, level of pain and overall condition will be monitored.  An IV tube will be inserted into one of your veins.  Your anesthesia specialist will give you medicines as needed to keep you comfortable during the procedure. This may mean changing the level of sedation.  The procedure will be performed. After the procedure  Your blood pressure, heart rate, breathing rate, and blood oxygen level will be monitored until the medicines you were given have worn off.  Do not drive for 24 hours if you received a sedative.  You may:  Feel sleepy, clumsy, or nauseous.  Feel forgetful about what happened after the procedure.  Have a sore throat if you had a breathing tube during the procedure.  Vomit. This information is not intended to replace advice given to you by your health care provider. Make sure you discuss any questions you have with your health care provider. Document Released: 04/04/2005 Document Revised: 12/16/2015 Document Reviewed: 10/30/2015 Elsevier Interactive Patient Education  2017 [place], Care After These instructions provide you with information about caring for yourself after your procedure. Your health care provider may also give you  more specific instructions. Your treatment has been planned according to current medical practices, but problems sometimes occur. Call your health care provider if you have any problems or questions after your procedure. What can I expect after the procedure? After your procedure, it is common to:  Feel sleepy for several hours.  Feel clumsy and have poor balance for several hours.  Feel forgetful about what happened after the procedure.  Have poor judgment for several hours.  Feel nauseous or vomit.  Have a sore throat if you had a breathing tube during the procedure. Follow these instructions at home: For at least 24 hours after the procedure:    Do not:  Participate in activities in which you could fall or become injured.  Drive.  Use heavy machinery.  Drink alcohol.  Take sleeping pills or medicines that cause drowsiness.  Make important decisions or sign legal documents.  Take care of children on your own.  Rest. Eating and drinking   Follow the diet that is recommended by your health care provider.  If you vomit, drink water, juice, or soup when you can drink without vomiting.  Make sure you have little or no nausea before eating solid foods. General instructions   Have a responsible adult stay with you until you are awake and alert.  Take over-the-counter and prescription medicines only as told by your health care provider.  If you smoke, do not smoke without supervision.  Keep all follow-up visits as told by your health care provider. This is important. Contact a health care provider if:  You keep feeling nauseous or you keep vomiting.  You feel light-headed.  You develop a rash.  You have a fever. Get help right away if:  You have trouble breathing. This information is not intended to replace advice given to you by your health care provider. Make sure you discuss any questions you have with your health care provider. Document Released:  10/30/2015 Document Revised: 02/29/2016 Document Reviewed: 10/30/2015 Elsevier Interactive Patient Education  2017 [employer].

## 2016-11-16 ENCOUNTER — Encounter (HOSPITAL_COMMUNITY)
Admission: RE | Admit: 2016-11-16 | Discharge: 2016-11-16 | Disposition: A | Discharge status: Home / Self Care | Payer: Self-pay | Source: Ambulatory Visit | Attending: Internal Medicine | Admitting: Internal Medicine

## 2016-11-16 ENCOUNTER — Encounter (HOSPITAL_COMMUNITY): Payer: Self-pay

## 2016-11-16 DIAGNOSIS — I8500 Esophageal varices without bleeding: Secondary | ICD-10-CM | POA: Insufficient documentation

## 2016-11-16 HISTORY — DX: Unspecified osteoarthritis, unspecified site: M19.90

## 2016-11-16 HISTORY — DX: Essential (primary) hypertension: I10

## 2016-11-16 HISTORY — DX: Gastro-esophageal reflux disease without esophagitis: K21.9

## 2016-11-16 LAB — CBC WITH DIFFERENTIAL/PLATELET
BASOS ABS: 0 10*3/uL (ref 0.0–0.1)
Basophils Relative: 0 %
EOS PCT: 3 %
Eosinophils Absolute: 0.1 10*3/uL (ref 0.0–0.7)
HEMATOCRIT: 39.7 % (ref 39.0–52.0)
Hemoglobin: 13.6 g/dL (ref 13.0–17.0)
LYMPHS PCT: 37 %
Lymphs Abs: 2 10*3/uL (ref 0.7–4.0)
MCH: 34 pg (ref 26.0–34.0)
MCHC: 34.3 g/dL (ref 30.0–36.0)
MCV: 99.3 fL (ref 78.0–100.0)
Monocytes Absolute: 0.5 10*3/uL (ref 0.1–1.0)
Monocytes Relative: 9 %
Neutro Abs: 2.8 10*3/uL (ref 1.7–7.7)
Neutrophils Relative %: 51 %
PLATELETS: 133 10*3/uL — AB (ref 150–400)
RBC: 4 MIL/uL — AB (ref 4.22–5.81)
RDW: 13.7 % (ref 11.5–15.5)
WBC: 5.5 10*3/uL (ref 4.0–10.5)

## 2016-11-16 LAB — COMPREHENSIVE METABOLIC PANEL
ALBUMIN: 3.8 g/dL (ref 3.5–5.0)
ALT: 14 U/L — AB (ref 17–63)
AST: 21 U/L (ref 15–41)
Alkaline Phosphatase: 85 U/L (ref 38–126)
Anion gap: 7 (ref 5–15)
BUN: 9 mg/dL (ref 6–20)
CHLORIDE: 103 mmol/L (ref 101–111)
CO2: 27 mmol/L (ref 22–32)
CREATININE: 0.75 mg/dL (ref 0.61–1.24)
Calcium: 9.4 mg/dL (ref 8.9–10.3)
GFR calc Af Amer: >60 mL/min (ref >60)
GLUCOSE: 91 mg/dL (ref 65–99)
Potassium: 3.9 mmol/L (ref 3.5–5.1)
Sodium: 137 mmol/L (ref 135–145)
Total Bilirubin: 0.8 mg/dL (ref 0.3–1.2)
Total Protein: 7.3 g/dL (ref 6.5–8.1)

## 2016-11-23 ENCOUNTER — Encounter (HOSPITAL_COMMUNITY): Payer: Self-pay | Admitting: *Deleted

## 2016-11-23 ENCOUNTER — Encounter (HOSPITAL_COMMUNITY)
Admission: RE | Disposition: A | Discharge status: Home / Self Care | Payer: Self-pay | Source: Ambulatory Visit | Attending: Internal Medicine

## 2016-11-23 ENCOUNTER — Ambulatory Visit (HOSPITAL_COMMUNITY): Payer: Self-pay | Admitting: Anesthesiology

## 2016-11-23 ENCOUNTER — Ambulatory Visit (HOSPITAL_COMMUNITY)
Admission: RE | Admit: 2016-11-23 | Discharge: 2016-11-23 | Disposition: A | Discharge status: Home / Self Care | Payer: Self-pay | Source: Ambulatory Visit | Attending: Internal Medicine | Admitting: Internal Medicine

## 2016-11-23 DIAGNOSIS — I8500 Esophageal varices without bleeding: Secondary | ICD-10-CM

## 2016-11-23 DIAGNOSIS — I10 Essential (primary) hypertension: Secondary | ICD-10-CM | POA: Insufficient documentation

## 2016-11-23 DIAGNOSIS — K228 Other specified diseases of esophagus: Secondary | ICD-10-CM

## 2016-11-23 DIAGNOSIS — K766 Portal hypertension: Secondary | ICD-10-CM

## 2016-11-23 DIAGNOSIS — K219 Gastro-esophageal reflux disease without esophagitis: Secondary | ICD-10-CM | POA: Insufficient documentation

## 2016-11-23 DIAGNOSIS — K3189 Other diseases of stomach and duodenum: Secondary | ICD-10-CM

## 2016-11-23 DIAGNOSIS — Z09 Encounter for follow-up examination after completed treatment for conditions other than malignant neoplasm: Secondary | ICD-10-CM | POA: Insufficient documentation

## 2016-11-23 DIAGNOSIS — F17210 Nicotine dependence, cigarettes, uncomplicated: Secondary | ICD-10-CM | POA: Insufficient documentation

## 2016-11-23 DIAGNOSIS — K449 Diaphragmatic hernia without obstruction or gangrene: Secondary | ICD-10-CM | POA: Insufficient documentation

## 2016-11-23 HISTORY — PX: ESOPHAGOGASTRODUODENOSCOPY (EGD) WITH PROPOFOL: SHX5813

## 2016-11-23 HISTORY — PX: ESOPHAGEAL BANDING: SHX5518

## 2016-11-23 SURGERY — ESOPHAGOGASTRODUODENOSCOPY (EGD) WITH PROPOFOL
Anesthesia: Moderate Sedation

## 2016-11-23 SURGERY — ESOPHAGOGASTRODUODENOSCOPY (EGD) WITH PROPOFOL
Anesthesia: Monitor Anesthesia Care

## 2016-11-23 MED ORDER — LIDOCAINE HCL (PF) 1 % IJ SOLN
INTRAMUSCULAR | Status: AC
  Filled 2016-11-23: qty 5

## 2016-11-23 MED ORDER — FENTANYL CITRATE (PF) 100 MCG/2ML IJ SOLN
INTRAMUSCULAR | Status: AC
  Filled 2016-11-23: qty 2

## 2016-11-23 MED ORDER — PROPOFOL 10 MG/ML IV BOLUS
INTRAVENOUS | Status: DC | PRN
  Administered 2016-11-23 (×3): 20 mg via INTRAVENOUS

## 2016-11-23 MED ORDER — LACTATED RINGERS IV SOLN
INTRAVENOUS | Status: DC
  Administered 2016-11-23: 12:00:00 via INTRAVENOUS

## 2016-11-23 MED ORDER — LIDOCAINE VISCOUS 2 % MT SOLN
OROMUCOSAL | Status: AC
  Filled 2016-11-23: qty 15

## 2016-11-23 MED ORDER — MIDAZOLAM HCL 2 MG/2ML IJ SOLN
INTRAMUSCULAR | Status: AC
  Filled 2016-11-23: qty 2

## 2016-11-23 MED ORDER — MIDAZOLAM HCL 5 MG/5ML IJ SOLN
INTRAMUSCULAR | Status: DC | PRN
  Administered 2016-11-23: 2 mg via INTRAVENOUS

## 2016-11-23 MED ORDER — MIDAZOLAM HCL 2 MG/2ML IJ SOLN
1.0000 mg | INTRAMUSCULAR | Status: AC
  Administered 2016-11-23: 2 mg via INTRAVENOUS

## 2016-11-23 MED ORDER — FENTANYL CITRATE (PF) 100 MCG/2ML IJ SOLN
25.0000 ug | INTRAMUSCULAR | Status: AC
  Administered 2016-11-23 (×2): 25 ug via INTRAVENOUS

## 2016-11-23 MED ORDER — PROPOFOL 10 MG/ML IV BOLUS
INTRAVENOUS | Status: AC
  Filled 2016-11-23: qty 60

## 2016-11-23 MED ORDER — CHLORHEXIDINE GLUCONATE CLOTH 2 % EX PADS: 6.0000 | MEDICATED_PAD | Freq: Once | CUTANEOUS | Status: DC

## 2016-11-23 MED ORDER — PROPOFOL 500 MG/50ML IV EMUL
INTRAVENOUS | Status: DC | PRN
  Administered 2016-11-23: 100 ug/kg/min via INTRAVENOUS

## 2016-11-23 MED ORDER — LIDOCAINE VISCOUS 2 % MT SOLN
5.0000 mL | Freq: Two times a day (BID) | OROMUCOSAL | Status: DC
  Administered 2016-11-23: 5 mL via OROMUCOSAL

## 2016-11-23 NOTE — Anesthesia Procedure Notes (Signed)
Procedure Name: MAC Date/Time: 11/23/2016 12:08 PM Performed by: Vista Deck Pre-anesthesia Checklist: Patient identified, Emergency Drugs available, Suction available, Timeout performed and Patient being monitored Patient Re-evaluated:Patient Re-evaluated prior to inductionOxygen Delivery Method: Non-rebreather mask

## 2016-11-23 NOTE — Anesthesia Preprocedure Evaluation (Signed)
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 UGI bleed    Endo/Other    Renal/GU      Musculoskeletal   Abdominal   Peds  Hematology  (+) Blood dyscrasia ( Thrombocytopenia), ,   Anesthesia Other Findings   Reproductive/Obstetrics                             Anesthesia Physical Anesthesia Plan  ASA: III  Anesthesia Plan: MAC   Post-op Pain Management:    Induction: Intravenous  Airway Management Planned: Simple Face Mask  Additional Equipment:   Intra-op Plan:   Post-operative Plan:   Informed Consent: I have reviewed the patients History and Physical, chart, labs and discussed the procedure including the risks, benefits and alternatives for the proposed anesthesia with the patient or authorized representative who has indicated his/her understanding and acceptance.     Plan Discussed with:   Anesthesia Plan Comments:         Anesthesia Quick Evaluation

## 2016-11-23 NOTE — Addendum Note (Signed)
Addendum  created 11/23/16 1303 by Franco Noneseresa S Shareef Eddinger, CRNA   Anesthesia Staff edited

## 2016-11-23 NOTE — Anesthesia Postprocedure Evaluation (Signed)
Anesthesia Post Note  Patient: Brett [name]  Procedure(s) Performed: Procedure(s) (LRB): ESOPHAGOGASTRODUODENOSCOPY (EGD) WITH PROPOFOL (N/A) ESOPHAGEAL BANDING (N/A)  Patient location during evaluation: PACU Anesthesia Type: MAC Level of consciousness: awake, oriented and patient cooperative Pain management: pain level controlled Vital Signs Assessment: post-procedure vital signs reviewed and stable Respiratory status: spontaneous breathing, nonlabored ventilation and respiratory function stable Cardiovascular status: blood pressure returned to baseline Postop Assessment: no signs of nausea or vomiting     Last Vitals:  Vitals:   11/23/16 1155 11/23/16 1200  BP: 107/64 107/66  Pulse:    Resp: 13 14  Temp:      Last Pain:  Vitals:   11/23/16 1127  TempSrc: Oral                 Emilina Smarr J

## 2016-11-23 NOTE — Op Note (Signed)
[facility] Patient Name: Brett [name] Procedure Date: 11/23/2016 12:07 PM MRN: [id number] Date of Birth: 1961-04-21 Attending MD: Lionel December , MD CSN: [id number] Age: 56 Admit Type: Outpatient Procedure:                Upper GI endoscopy Indications:              Follow-up of esophageal varices, For therapy of                            esophageal varices Providers:                Lionel December, MD, Loma Messing B. Patsy Lager, RN, Burke Keels, Technician Referring MD:              Medicines:                Lidocaine spray, Propofol per Anesthesia Complications:            No immediate complications. Estimated Blood Loss:     Estimated blood loss: none. Procedure:                Pre-Anesthesia Assessment:                           - Prior to the procedure, a History and Physical                            was performed, and patient medications and                            allergies were reviewed. The patient's tolerance of                            previous anesthesia was also reviewed. The risks                            and benefits of the procedure and the sedation                            options and risks were discussed with the patient.                            All questions were answered, and informed consent                            was obtained. Prior Anticoagulants: The patient has                            taken no previous anticoagulant or antiplatelet                            agents. ASA Grade Assessment: III - A patient with  severe systemic disease. After reviewing the risks                            and benefits, the patient was deemed in                            satisfactory condition to undergo the procedure.                           After obtaining informed consent, the endoscope was                            passed under direct vision. Throughout the                            procedure, the  patient's blood pressure, pulse, and                            oxygen saturations were monitored continuously. The                            EG-[id number] ([id number]) scope was introduced through the                            and advanced to the second part of duodenum. The                            upper GI endoscopy was accomplished without                            difficulty. The patient tolerated the procedure                            well. Scope In: 12:16:04 PM Scope Out: 12:27:23 PM Total Procedure Duration: 0 hours 11 minutes 19 seconds  Findings:      The mid esophagus and distal esophagus were normal.      Two culums grade I, one column grade III varices were found in the lower       third of the esophagus. One band was successfully placed with complete       eradication, resulting in deflation of varices. There was no bleeding       during, and at the end, of the procedure.      There were esophageal mucosal changes suspicious for short-segment       Barrett's esophagus present in the distal esophagus. The maximum       longitudinal extent of these mucosal changes was 2 cm in length.      A 2 cm hiatal hernia was present.      Mild portal hypertensive gastropathy was found in the gastric fundus and       in the gastric body.      The exam of the stomach was otherwise normal.      The duodenal bulb and second portion of the duodenum were normal. Impression:               - Normal mid  esophagus and distal esophagus.                           - Grade I and grade III esophageal varices.                            Completely eradicated. Banded.                           - Esophageal mucosal changes suspicious for                            short-segment Barrett's esophagus. Biopsies not                            taken.                           - 2 cm hiatal hernia.                           - Portal hypertensive gastropathy.                           - Normal duodenal bulb and  second portion of the                            duodenum.                           - No specimens collected. Moderate Sedation:      Per Anesthesia Care Recommendation:           - Patient has a contact number available for                            emergencies. The signs and symptoms of potential                            delayed complications were discussed with the                            patient. Return to normal activities tomorrow.                            Written discharge instructions were provided to the                            patient.                           - Full liquid diet today.                           - Resume previous diet for 1 day.                           - Continue present medications.                           -  Return to GI clinic in 2 months. Procedure Code(s):        --- Professional ---                           [phone], Esophagogastroduodenoscopy, flexible,                            transoral; with band ligation of esophageal/gastric                            varices Diagnosis Code(s):        --- Professional ---                           K22.8, Other specified diseases of esophagus                           I85.00, Esophageal varices without bleeding                           K44.9, Diaphragmatic hernia without obstruction or                            gangrene                           K76.6, Portal hypertension                           K31.89, Other diseases of stomach and duodenum CPT copyright 2016 American Medical Association. All rights reserved. The codes documented in this report are preliminary and upon coder review may  be revised to meet current compliance requirements. Lionel December, MD Lionel December, MD 11/23/2016 12:37:11 PM This report has been signed electronically. Number of Addenda: 0

## 2016-11-23 NOTE — Discharge Instructions (Signed)
Resume usual medications. Full liquids today and usual diet starting tomorrow morning. No driving for 24 hours. Office visit in July as planned.  Esophagogastroduodenoscopy, Care After Refer to this sheet in the next few weeks. These instructions provide you with information about caring for yourself after your procedure. Your health care provider may also give you more specific instructions. Your treatment has been planned according to current medical practices, but problems sometimes occur. Call your health care provider if you have any problems or questions after your procedure. What can I expect after the procedure? After the procedure, it is common to have:  A sore throat.  Nausea.  Bloating.  Dizziness.  Fatigue. Follow these instructions at home:  Do not eat or drink anything until the numbing medicine (local anesthetic) has worn off and your gag reflex has returned. You will know that the local anesthetic has worn off when you can swallow comfortably.  Do not drive for 24 hours if you received a medicine to help you relax (sedative).  If your health care provider took a tissue sample for testing during the procedure, make sure to get your test results. This is your responsibility. Ask your health care provider or the department performing the test when your results will be ready.  Keep all follow-up visits as told by your health care provider. This is important. Contact a health care provider if:  You cannot stop coughing.  You are not urinating.  You are urinating less than usual. Get help right away if:  You have trouble swallowing.  You cannot eat or drink.  You have throat or chest pain that gets worse.  You are dizzy or light-headed.  You faint.  You have nausea or vomiting.  You have chills.  You have a fever.  You have severe abdominal pain.  You have black, tarry, or bloody stools. This information is not intended to replace advice given to you  by your health care provider. Make sure you discuss any questions you have with your health care provider. Document Released: 06/25/2012 Document Revised: 12/15/2015 Document Reviewed: 06/02/2015 Elsevier Interactive Patient Education  2017 [employer]

## 2016-11-23 NOTE — H&P (Signed)
Brett [name] is an 56 y.o. male.   Chief Complaint: Patient's here for EGD and possible esophageal variceal banding. HPI: Patient is 56 year old Caucasian male who presented or 2 months ago with upper GI bleed secondary esophageal varices. He underwent esophageal variceal banding.  's not had any more bleeding. He has not had any alcohol in over 2 months. He denies nausea vomiting dysphagia or abdominal pain or melena.   Past Medical History:  Diagnosis Date  . Arthritis   . Colitis   . GERD (gastroesophageal reflux disease)   . Hypertension       Alcoholic liver disease.  Past Surgical History:  Procedure Laterality Date  . ESOPHAGOGASTRODUODENOSCOPY (EGD) WITH PROPOFOL N/A 09/19/2016   Procedure: ESOPHAGOGASTRODUODENOSCOPY (EGD) WITH PROPOFOL;  Surgeon: Malissa HippoNajeeb U Deaysia Grigoryan, MD;  Location: [facility];  Service: Endoscopy;  Laterality: N/A;  possible esophageal variceal banding  . KNEE SURGERY Right    torn meniscus  . SURGERY SCROTAL / TESTICULAR      History reviewed. No pertinent family history. Social History:  reports that he has been smoking Cigarettes.  He has a 40.00 pack-year smoking history. He has never used smokeless tobacco. He reports that he uses drugs, including Marijuana. He reports that he does not drink alcohol.  Allergies:  Allergies  Allergen Reactions  . Other     Fruits cause oral swelling (cantaloupes especially)Cashews cause lips to tingle.    Medications Prior to Admission  Medication Sig Dispense Refill  . pantoprazole (PROTONIX) 40 MG tablet Take 1 tablet (40 mg total) by mouth daily. (Patient taking differently: Take 40 mg by mouth every evening. ) 60 tablet 3  . propranolol (INDERAL) 10 MG tablet Take 1 tablet (10 mg total) by mouth 2 (two) times daily. (Patient taking differently: Take 10 mg by mouth 2 (two) times daily. ) 60 tablet 3    No results found for this or any previous visit (from the past 48 hour(s)). No results found.  ROS  Blood  pressure 107/66, pulse 62, temperature 97.7 F (36.5 C), temperature source Oral, resp. rate 14, height 5\' 10"  (1.778 m), weight 142 lb (64.4 kg), SpO2 95 %. Physical Exam  Constitutional: He appears well-developed and well-nourished.  HENT:  Mouth/Throat: Oropharynx is clear and moist.  Eyes: Conjunctivae are normal. No scleral icterus.  Neck: No thyromegaly present.  Cardiovascular: Normal rate, regular rhythm and normal heart sounds.   No murmur heard. Respiratory: Effort normal and breath sounds normal.  GI: Soft. Bowel sounds are normal.  Musculoskeletal: He exhibits no edema.  Lymphadenopathy:    He has no cervical adenopathy.  Neurological: He is alert.  Skin: Skin is warm and dry.     Assessment/Plan History of esophageal variceal bleed. EGD with possible esophageal variceal banding.  Brett DecemberNajeeb Jaileigh Weimer, MD 11/23/2016, 12:03 PM

## 2016-11-23 NOTE — Transfer of Care (Signed)
Immediate Anesthesia Transfer of Care Note  Patient: Brett [name]  Procedure(s) Performed: Procedure(s): ESOPHAGOGASTRODUODENOSCOPY (EGD) WITH PROPOFOL (N/A) ESOPHAGEAL BANDING (N/A)  Patient Location: PACU  Anesthesia Type:MAC  Level of Consciousness: awake and patient cooperative  Airway & Oxygen Therapy: Patient Spontanous Breathing  Post-op Assessment: Report given to RN and Post -op Vital signs reviewed and stable  Post vital signs: Reviewed and stable  Last Vitals:  Vitals:   11/23/16 1155 11/23/16 1200  BP: 107/64 107/66  Pulse:    Resp: 13 14  Temp:      Last Pain:  Vitals:   11/23/16 1127  TempSrc: Oral      Patients Stated Pain Goal: 5 (19/16/60 6004)  Complications: No apparent anesthesia complications

## 2016-11-29 ENCOUNTER — Encounter (HOSPITAL_COMMUNITY): Payer: Self-pay | Admitting: Internal Medicine

## 2016-12-27 ENCOUNTER — Emergency Department (HOSPITAL_COMMUNITY): Payer: Self-pay

## 2016-12-27 ENCOUNTER — Encounter (HOSPITAL_COMMUNITY): Payer: Self-pay | Admitting: Emergency Medicine

## 2016-12-27 ENCOUNTER — Emergency Department (HOSPITAL_COMMUNITY)
Admission: EM | Admit: 2016-12-27 | Discharge: 2016-12-27 | Disposition: A | Discharge status: Home Health | Payer: Self-pay | Attending: Emergency Medicine | Admitting: Emergency Medicine

## 2016-12-27 DIAGNOSIS — I10 Essential (primary) hypertension: Secondary | ICD-10-CM | POA: Insufficient documentation

## 2016-12-27 DIAGNOSIS — F17210 Nicotine dependence, cigarettes, uncomplicated: Secondary | ICD-10-CM | POA: Insufficient documentation

## 2016-12-27 DIAGNOSIS — M7711 Lateral epicondylitis, right elbow: Secondary | ICD-10-CM | POA: Insufficient documentation

## 2016-12-27 DIAGNOSIS — Z79899 Other long term (current) drug therapy: Secondary | ICD-10-CM | POA: Insufficient documentation

## 2016-12-27 MED ORDER — IBUPROFEN 600 MG PO TABS: 600.0000 mg | ORAL_TABLET | Freq: Four times a day (QID) | ORAL | 0 refills | Status: AC | PRN

## 2016-12-27 MED ORDER — TRAMADOL HCL 50 MG PO TABS: 50.0000 mg | ORAL_TABLET | Freq: Four times a day (QID) | ORAL | 0 refills | Status: AC | PRN

## 2016-12-27 NOTE — ED Triage Notes (Signed)
Patient complaining of right elbow pain after heavy lifting over the weekend. States pain worsened last night.

## 2016-12-27 NOTE — Discharge Instructions (Signed)
Wear the sling as needed, but not continuously.  Call Dr. Mort SawyersHArrison's office to arrange a follow-up appt in one week if not improving

## 2016-12-29 NOTE — ED Provider Notes (Signed)
[facility] Provider Note   CSN: [id number] Arrival date & time: 12/27/16  1228     History   Chief Complaint Chief Complaint  Patient presents with  . Elbow Pain    HPI Brett [name] is a 56 y.o. male.  HPI  Brett [name] is a 56 y.o. male who presents to the Emergency Department complaining of right elbow pain for several days.  Pain onset after repetitive, heavy lifting.  He describes throbbing pain with flexion and rotation of the outer side of the elbow.  He has not tried any therapies.  He denies trauma, swelling, redness, radiating pain, and numbness of the arm.    Past Medical History:  Diagnosis Date  . Arthritis   . Colitis   . GERD (gastroesophageal reflux disease)   . Hypertension     Patient Active Problem List   Diagnosis Date Noted  . Esophageal varices without bleeding (HCC) 10/24/2016  . GI bleed 09/19/2016  . Alcohol abuse 09/19/2016  . Thrombocytopenia (HCC) 09/19/2016    Past Surgical History:  Procedure Laterality Date  . ESOPHAGEAL BANDING N/A 11/23/2016   Procedure: ESOPHAGEAL BANDING;  Surgeon: Malissa Hippo, MD;  Location: [facility];  Service: Endoscopy;  Laterality: N/A;  . ESOPHAGOGASTRODUODENOSCOPY (EGD) WITH PROPOFOL N/A 09/19/2016   Procedure: ESOPHAGOGASTRODUODENOSCOPY (EGD) WITH PROPOFOL;  Surgeon: Malissa Hippo, MD;  Location: [facility];  Service: Endoscopy;  Laterality: N/A;  possible esophageal variceal banding  . ESOPHAGOGASTRODUODENOSCOPY (EGD) WITH PROPOFOL N/A 11/23/2016   Procedure: ESOPHAGOGASTRODUODENOSCOPY (EGD) WITH PROPOFOL;  Surgeon: Malissa Hippo, MD;  Location: [facility];  Service: Endoscopy;  Laterality: N/A;  . KNEE SURGERY Right    torn meniscus  . SURGERY SCROTAL / TESTICULAR         Home Medications    Prior to Admission medications   Medication Sig Start Date End Date Taking? Authorizing Provider  ibuprofen (ADVIL,MOTRIN) 600 MG tablet Take 1 tablet (600 mg total) by mouth every 6  (six) hours as needed. Take with food 12/27/16   Branon Sabine, PA-C  pantoprazole (PROTONIX) 40 MG tablet Take 1 tablet (40 mg total) by mouth daily. Patient taking differently: Take 40 mg by mouth every evening.  09/22/16   Oval Linsey, MD  propranolol (INDERAL) 10 MG tablet Take 1 tablet (10 mg total) by mouth 2 (two) times daily. Patient taking differently: Take 10 mg by mouth 2 (two) times daily.  09/22/16   Dondiego, Gerlene Burdock, MD  traMADol (ULTRAM) 50 MG tablet Take 1 tablet (50 mg total) by mouth every 6 (six) hours as needed. 12/27/16   Pauline Aus, PA-C    Family History History reviewed. No pertinent family history.  Social History Social History  Substance Use Topics  . Smoking status: Current Every Day Smoker    Packs/day: 1.00    Years: 40.00    Types: Cigarettes  . Smokeless tobacco: Never Used  . Alcohol use No     Comment: pt sts he stopped 09/19/2016.     Allergies   Other   Review of Systems Review of Systems  Constitutional: Negative for chills and fever.  Respiratory: Negative for shortness of breath.   Cardiovascular: Negative for chest pain.  Musculoskeletal: Positive for arthralgias (right elbow pain). Negative for joint swelling and neck pain.  Skin: Negative for color change and wound.  Neurological: Negative for weakness and numbness.  All other systems reviewed and are negative.    Physical Exam Updated Vital Signs  BP 129/66 (BP Location: Left Arm)   Pulse (!) 56   Temp 97.6 F (36.4 C) (Oral)   Resp 16   Ht 5\' 10"  (1.778 m)   Wt 65.8 kg (145 lb)   SpO2 99%   BMI 20.81 kg/m   Physical Exam  Constitutional: He is oriented to person, place, and time. He appears well-developed and well-nourished. No distress.  HENT:  Head: Normocephalic and atraumatic.  Cardiovascular: Normal rate, regular rhythm, normal heart sounds and intact distal pulses.   Pulmonary/Chest: Effort normal and breath sounds normal. He exhibits no tenderness.    Musculoskeletal: He exhibits tenderness. He exhibits no edema.  ttp of the lateral right elbow.  No edema, erythema or bony deformity.  Patient has full ROM. Compartments soft.  Neurological: He is alert and oriented to person, place, and time. No sensory deficit. He exhibits normal muscle tone. Coordination normal.  Skin: Skin is warm and dry. Capillary refill takes less than 2 seconds.  Nursing note and vitals reviewed.    ED Treatments / Results  Labs (all labs ordered are listed, but only abnormal results are displayed) Labs Reviewed - No data to display  EKG  EKG Interpretation None       Radiology Dg Elbow Complete Right  Result Date: 12/27/2016 CLINICAL DATA:  C/o posterior right elbow pain x 3-4 days. Pt states he was lifting heavy objects this weekend. No particular injury that he recalls. Hx arthritis EXAM: RIGHT ELBOW - COMPLETE 3+ VIEW COMPARISON:  09/19/2016 FINDINGS: There is no evidence of fracture, dislocation, or joint effusion. There is no evidence of arthropathy or other focal bone abnormality. Soft tissues are unremarkable. IMPRESSION: Negative. Electronically Signed   By: Norva PavlovElizabeth  Brown M.D.   On: 12/27/2016 13:00     Procedures Procedures (including critical care time)  Medications Ordered in ED Medications - No data to display   Initial Impression / Assessment and Plan / ED Course  I have reviewed the triage vital signs and the nursing notes.  Pertinent labs & imaging results that were available during my care of the patient were reviewed by me and considered in my medical decision making (see chart for details).     ttp of the lateral elbow, NV intact.  No motor deficits, no concerning sx's for septic joint. Likely lateral epicondylitis      Final Clinical Impressions(s) / ED Diagnoses   Final diagnoses:  Lateral epicondylitis of right elbow    New Prescriptions Discharge Medication List as of 12/27/2016  1:33 PM    START taking these  medications   Details  ibuprofen (ADVIL,MOTRIN) 600 MG tablet Take 1 tablet (600 mg total) by mouth every 6 (six) hours as needed. Take with food, Starting Thu 12/27/2016, Print    traMADol (ULTRAM) 50 MG tablet Take 1 tablet (50 mg total) by mouth every 6 (six) hours as needed., Starting Thu 12/27/2016, Print         Loida Calamia, [place], PA-C 12/29/16 1934    Eber HongMiller, Brian, MD 12/30/16 [phone]

## 2017-02-19 ENCOUNTER — Encounter (INDEPENDENT_AMBULATORY_CARE_PROVIDER_SITE_OTHER): Payer: Self-pay | Admitting: Internal Medicine

## 2017-02-19 ENCOUNTER — Ambulatory Visit (INDEPENDENT_AMBULATORY_CARE_PROVIDER_SITE_OTHER): Payer: Self-pay | Admitting: Internal Medicine

## 2018-02-27 IMAGING — DX DG ELBOW COMPLETE 3+V*R*
4 series · 4 of 4 positions shown · non-contrast
Comparison: 09/19/2016

CLINICAL DATA: C/o posterior right elbow pain x 3-4 days. Pt states
he was lifting heavy objects this weekend. No particular injury that
he recalls. Hx arthritis

EXAM:
RIGHT ELBOW - COMPLETE 3+ VIEW

[elbow ap]
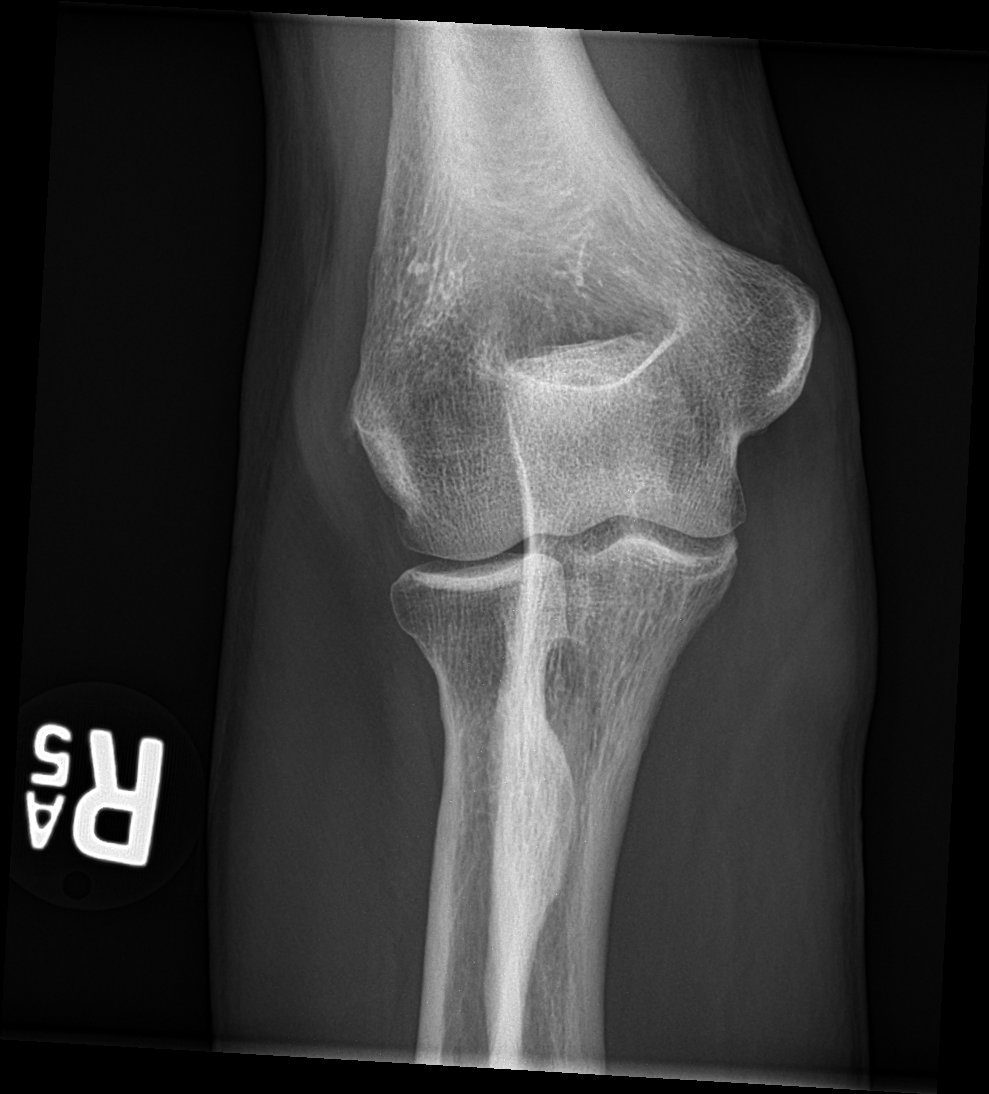

[elbow obl (1 of 2)]
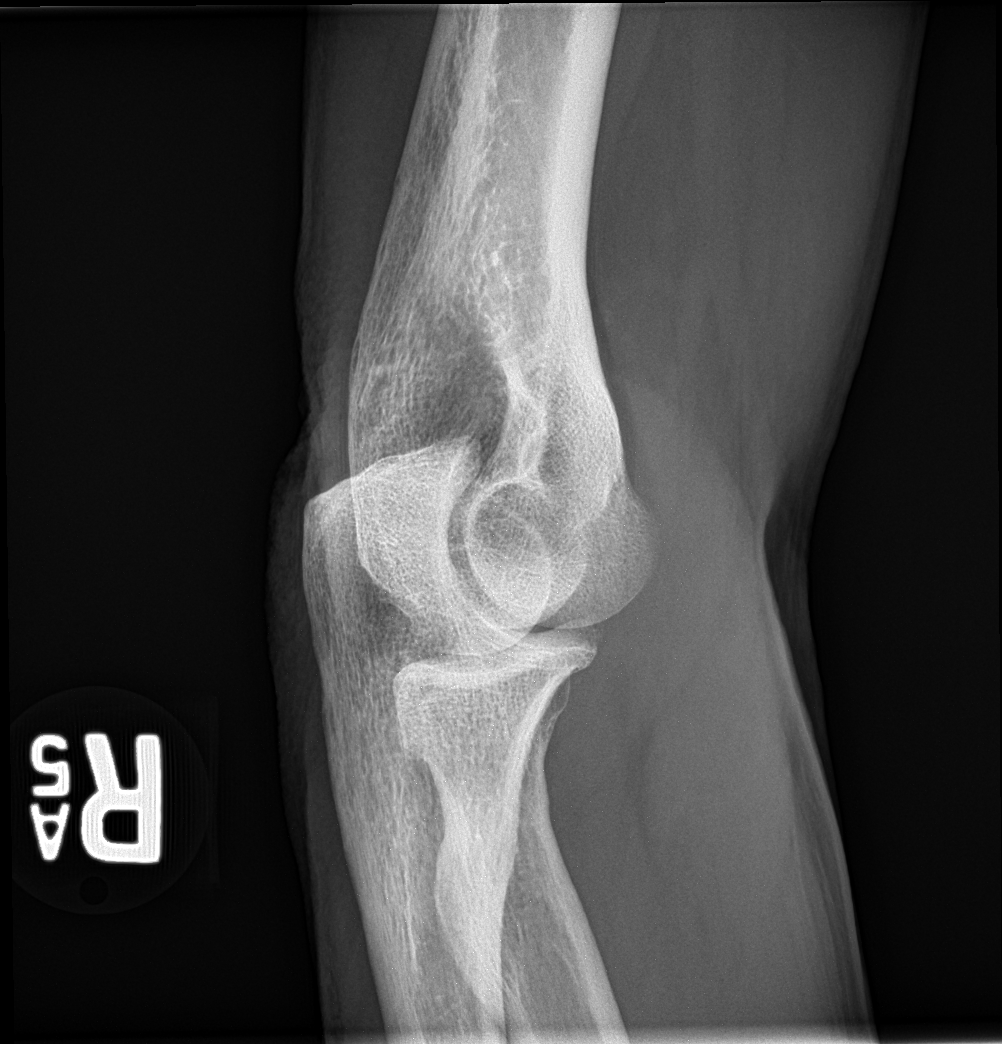

[elbow obl (2 of 2)]
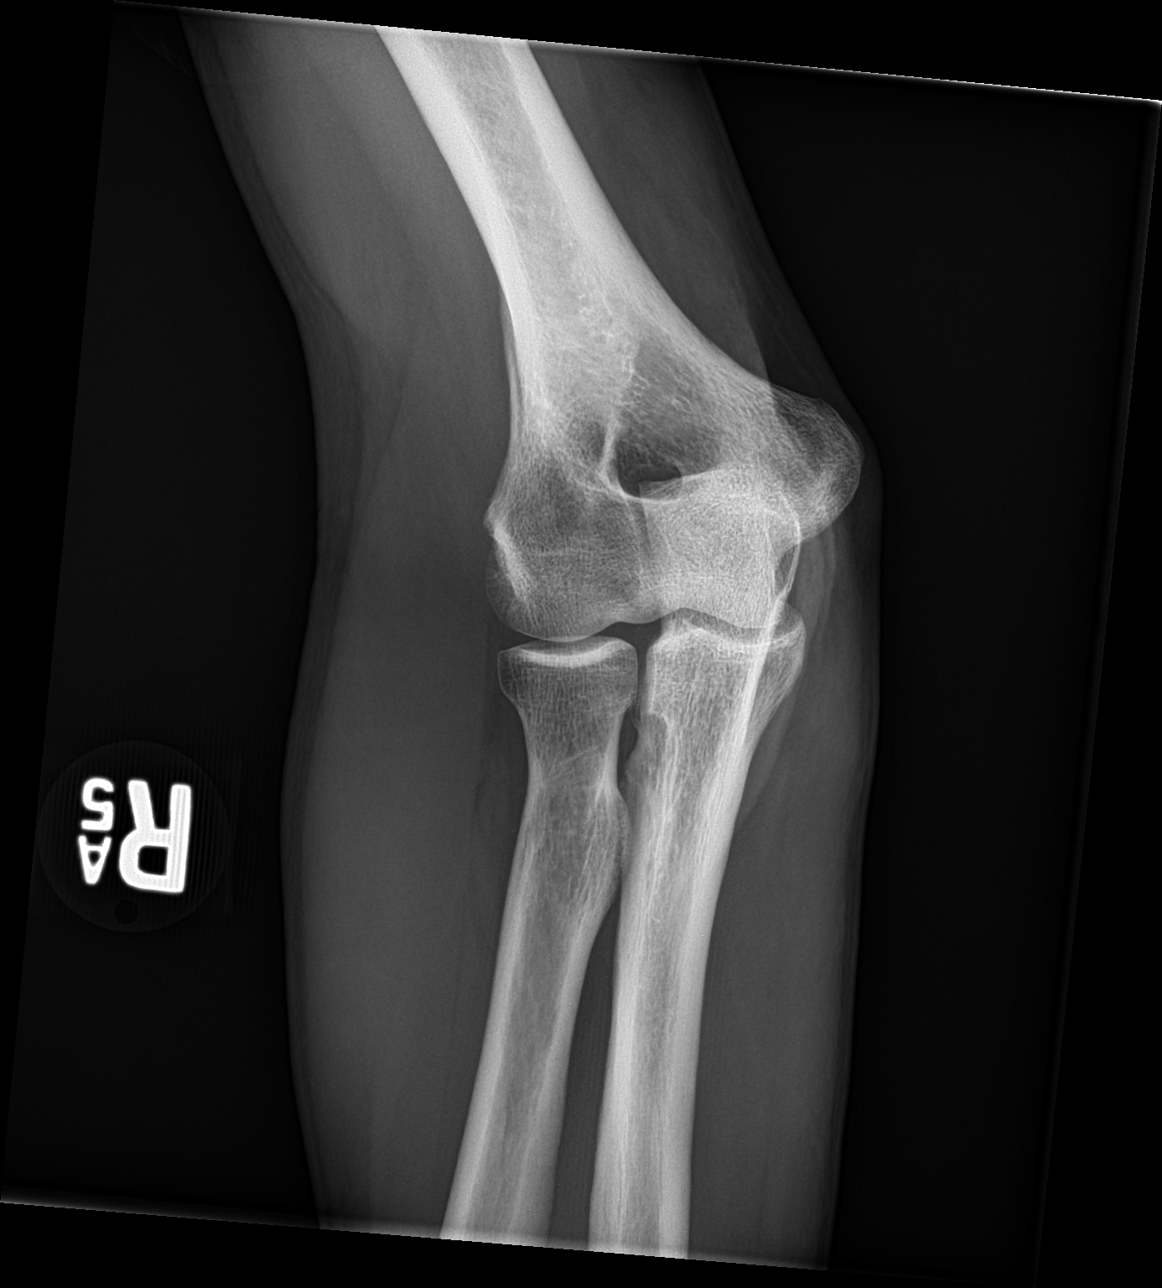

[elbow lat]
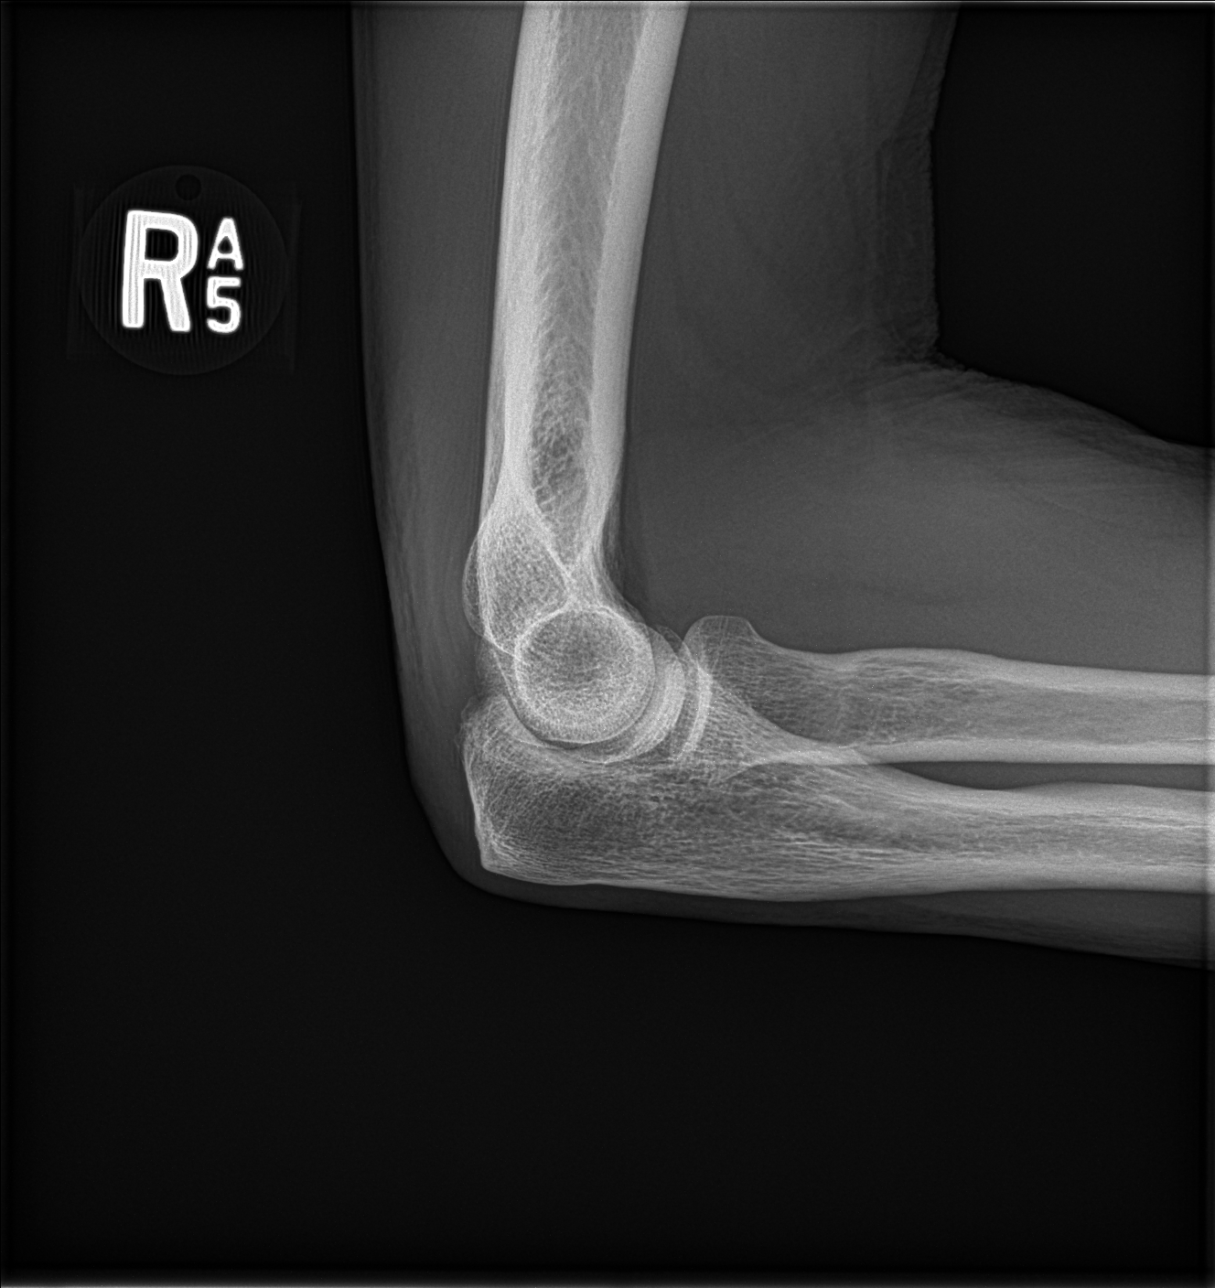

[4 of 4 positions shown; findings below may reference images not displayed]

FINDINGS: There is no evidence of fracture, dislocation, or joint effusion.
There is no evidence of arthropathy or other focal bone abnormality.
Soft tissues are unremarkable.
IMPRESSION: Negative.

## 2020-08-11 ENCOUNTER — Ambulatory Visit
Admission: RE | Admit: 2020-08-11 | Discharge: 2020-08-11 | Disposition: A | Discharge status: Home / Self Care | Payer: Self-pay | Source: Ambulatory Visit | Attending: Emergency Medicine | Admitting: Emergency Medicine

## 2020-08-11 ENCOUNTER — Other Ambulatory Visit: Payer: Self-pay

## 2020-08-11 VITALS — BP 122/71 | HR 76 | Temp 98.0°F | Resp 18

## 2020-08-11 DIAGNOSIS — N451 Epididymitis: Secondary | ICD-10-CM

## 2020-08-11 MED ORDER — CEFTRIAXONE SODIUM 500 MG IJ SOLR
1000.0000 mg | Freq: Once | INTRAMUSCULAR | Status: AC
  Administered 2020-08-11: 1000 mg via INTRAMUSCULAR

## 2020-08-11 MED ORDER — DOXYCYCLINE HYCLATE 100 MG PO CAPS: 100.0000 mg | ORAL_CAPSULE | Freq: Two times a day (BID) | ORAL | 0 refills | Status: DC

## 2020-08-11 NOTE — Discharge Instructions (Addendum)
Rocephin 1 g was given in office Doxycycline twice a day was 10 days was prescribed He was advised to follow-up with PCP Go to ED for further evaluation if symptom does not improve

## 2020-08-11 NOTE — ED Provider Notes (Signed)
[facility]   [id number] 08/11/20 Arrival Time: 1255   Chief Complaint  Patient presents with  . Testicle Pain     SUBJECTIVE: History from: patient and family.  DIRCK BUTCH is a 60 y.o. male with a history of epididymitis presented to the urgent care with a complaint of tenderness to right testicle for more than 10 days.  Denies any precipitating.  Reported he is sexually active and is married.  Has tried OTC NSAIDs without relief.  Denies alleviating or aggravating factors.  Report similar symptoms in the past.   Denies fever, chills, fatigue, sinus pain, rhinorrhea, sore throat, SOB, wheezing, chest pain, nausea, changes in bowel or bladder habits.    ROS: As per HPI.  All other pertinent ROS negative.     Past Medical History:  Diagnosis Date  . Arthritis   . Colitis   . GERD (gastroesophageal reflux disease)   . Hypertension    Past Surgical History:  Procedure Laterality Date  . ESOPHAGEAL BANDING N/A 11/23/2016   Procedure: ESOPHAGEAL BANDING;  Surgeon: Malissa Hippo, MD;  Location: [facility];  Service: Endoscopy;  Laterality: N/A;  . ESOPHAGOGASTRODUODENOSCOPY (EGD) WITH PROPOFOL N/A 09/19/2016   Procedure: ESOPHAGOGASTRODUODENOSCOPY (EGD) WITH PROPOFOL;  Surgeon: Malissa Hippo, MD;  Location: [facility];  Service: Endoscopy;  Laterality: N/A;  possible esophageal variceal banding  . ESOPHAGOGASTRODUODENOSCOPY (EGD) WITH PROPOFOL N/A 11/23/2016   Procedure: ESOPHAGOGASTRODUODENOSCOPY (EGD) WITH PROPOFOL;  Surgeon: Malissa Hippo, MD;  Location: [facility];  Service: Endoscopy;  Laterality: N/A;  . KNEE SURGERY Right    torn meniscus  . SURGERY SCROTAL / TESTICULAR     No Known Allergies No current facility-administered medications on file prior to encounter.   Current Outpatient Medications on File Prior to Encounter  Medication Sig Dispense Refill  . ibuprofen (ADVIL,MOTRIN) 600 MG tablet Take 1 tablet (600 mg total) by mouth every 6 (six)  hours as needed. Take with food 20 tablet 0  . pantoprazole (PROTONIX) 40 MG tablet Take 1 tablet (40 mg total) by mouth daily. (Patient taking differently: Take 40 mg by mouth every evening. ) 60 tablet 3  . propranolol (INDERAL) 10 MG tablet Take 1 tablet (10 mg total) by mouth 2 (two) times daily. (Patient taking differently: Take 10 mg by mouth 2 (two) times daily. ) 60 tablet 3  . traMADol (ULTRAM) 50 MG tablet Take 1 tablet (50 mg total) by mouth every 6 (six) hours as needed. 15 tablet 0   Social History   Socioeconomic History  . Marital status: Married    Spouse name: Not on file  . Number of children: Not on file  . Years of education: Not on file  . Highest education level: Not on file  Occupational History  . Not on file  Tobacco Use  . Smoking status: Current Every Day Smoker    Packs/day: 1.00    Years: 40.00    Pack years: 40.00    Types: Cigarettes  . Smokeless tobacco: Never Used  Vaping Use  . Vaping Use: Never used  Substance and Sexual Activity  . Alcohol use: No    Alcohol/week: 5.0 standard drinks    Types: 5 Cans of beer per week    Comment: pt sts he stopped 09/19/2016.  . Drug use: Not Currently    Types: Marijuana    Comment: daily for arthritic pain.  Marland Kitchen Sexual activity: Yes    Birth control/protection: None  Other Topics  Concern  . Not on file  Social History Narrative  . Not on file   Social Determinants of Health   Financial Resource Strain: Not on file  Food Insecurity: Not on file  Transportation Needs: Not on file  Physical Activity: Not on file  Stress: Not on file  Social Connections: Not on file  Intimate Partner Violence: Not on file   No family history on file.  OBJECTIVE:  Vitals:   08/11/20 1341  BP: 122/71  Pulse: 76  Resp: 18  Temp: 98 F (36.7 C)  TempSrc: Oral  SpO2: 95%     Physical Exam Vitals and nursing note reviewed.  Constitutional:      General: He is not in acute distress.    Appearance: Normal  appearance. He is normal weight. He is not ill-appearing, toxic-appearing or diaphoretic.  Cardiovascular:     Rate and Rhythm: Normal rate and regular rhythm.     Pulses: Normal pulses.     Heart sounds: Normal heart sounds. No murmur heard. No friction rub. No gallop.   Pulmonary:     Effort: Pulmonary effort is normal. No respiratory distress.     Breath sounds: Normal breath sounds. No stridor. No wheezing, rhonchi or rales.  Chest:     Chest wall: No tenderness.  Genitourinary:    Penis: Normal.      Testes:        Right: Tenderness and swelling present.  Neurological:     Mental Status: He is alert and oriented to person, place, and time.      LABS:  No results found for this or any previous visit (from the past 24 hour(s)).   ASSESSMENT & PLAN:  1. Epididymitis     Meds ordered this encounter  Medications  . cefTRIAXone (ROCEPHIN) injection 1,000 mg  . doxycycline (VIBRAMYCIN) 100 MG capsule    Sig: Take 1 capsule (100 mg total) by mouth 2 (two) times daily.    Dispense:  20 capsule    Refill:  0   Patient is stable at discharge.  He was given the option to go to the ER for further evaluation patient declined.  Report he has epididymitis 4-5 times  so is aware how symptoms feel like.  Discharge instructions  Rocephin 1 g was given in office Doxycycline twice a day was 10 days was prescribed He was advised to follow-up with PCP Go to ED for further evaluation if symptom does not improve  Reviewed expectations re: course of current medical issues. Questions answered. Outlined signs and symptoms indicating need for more acute intervention. Patient verbalized understanding. After Visit Summary given.         Durward Parcel, FNP 08/11/20 1428

## 2020-08-11 NOTE — ED Triage Notes (Signed)
Tenderness to RT testicle more than the LT x 10 days.  Pt has had epididymitis 4-5 times in his life.

## 2020-08-24 ENCOUNTER — Telehealth: Payer: Self-pay

## 2020-08-24 ENCOUNTER — Ambulatory Visit (HOSPITAL_COMMUNITY)
Admission: RE | Admit: 2020-08-24 | Discharge: 2020-08-24 | Disposition: A | Discharge status: Home / Self Care | Payer: Self-pay | Source: Ambulatory Visit | Attending: Urology | Admitting: Urology

## 2020-08-24 ENCOUNTER — Other Ambulatory Visit: Payer: Self-pay

## 2020-08-24 DIAGNOSIS — N50819 Testicular pain, unspecified: Secondary | ICD-10-CM | POA: Insufficient documentation

## 2020-08-24 NOTE — Telephone Encounter (Signed)
Spoke with pt and he said he has had R testicular pain over 10 days now. He stated he had a torsion when he was 18 and it feels like that type of pain. He was treated with Doxycycline for 10 days and he said it did not touch the pain. I spoke with Dr. Ronne Binning and he gave a verbal order for a stat Scrotal ultrasound . Notified pt the scheduler would be calling him.

## 2020-08-29 ENCOUNTER — Encounter: Payer: Self-pay | Admitting: Urology

## 2020-08-29 ENCOUNTER — Ambulatory Visit (INDEPENDENT_AMBULATORY_CARE_PROVIDER_SITE_OTHER): Payer: Self-pay | Admitting: Urology

## 2020-08-29 ENCOUNTER — Other Ambulatory Visit: Payer: Self-pay

## 2020-08-29 VITALS — BP 115/62 | HR 74 | Temp 97.6°F

## 2020-08-29 DIAGNOSIS — N50819 Testicular pain, unspecified: Secondary | ICD-10-CM

## 2020-08-29 DIAGNOSIS — N50811 Right testicular pain: Secondary | ICD-10-CM

## 2020-08-29 DIAGNOSIS — Z125 Encounter for screening for malignant neoplasm of prostate: Secondary | ICD-10-CM

## 2020-08-29 LAB — URINALYSIS, ROUTINE W REFLEX MICROSCOPIC
Bilirubin, UA: NEGATIVE
Glucose, UA: NEGATIVE
Ketones, UA: NEGATIVE
Leukocytes,UA: NEGATIVE
Nitrite, UA: NEGATIVE
Protein,UA: NEGATIVE
RBC, UA: NEGATIVE
Specific Gravity, UA: 1.015 (ref 1.005–1.030)
Urobilinogen, Ur: 0.2 mg/dL (ref 0.2–1.0)
pH, UA: 7 (ref 5.0–7.5)

## 2020-08-29 NOTE — Patient Instructions (Signed)
Pelvic Pain, Male Pelvic pain is pain in your lower abdomen, below your belly button and between your hips. The pain may start suddenly (be acute), keep coming back (recur), or last a long time (become chronic). Pelvic pain that lasts longer than six months is considered chronic. There are many possible causes of pelvic pain. Sometimes, the cause is not known. Pelvic pain may affect your:  Prostate gland.  Urinary system.  Digestive tract.  Musculoskeletal system. Strained muscles or ligaments may cause pelvic pain. Follow these instructions at home: Medicines  Take over-the-counter and prescription medicines only as told by your health care provider.  If you were prescribed an antibiotic medicine, take it as told by your health care provider. Do not stop taking the antibiotic even if you start to feel better. Managing pain, stiffness, and swelling  Take warm water baths (sitz baths). Sitz baths help with relaxing your pelvic floor muscles. ? For a sitz bath, the water only comes up to your hips and covers your buttocks. A sitz bath may done at home in a bathtub or with a portable sitz bath that fits over the toilet.  If directed, apply heat to the affected area before you exercise. Use the heat source that your health care provider recommends, such as a moist heat pack or a heating pad. ? Place a towel between your skin and the heat source. ? Leave the heat on for 20-30 minutes. ? Remove the heat if your skin turns bright red. This is especially important if you are unable to feel pain, heat, or cold. You may have a greater risk of getting burned.   General instructions  Rest as told by your health care provider.  Keep a journal of your pelvic pain. Write down: ? When the pain started. ? Where the pain is located. ? What seems to make the pain better or worse. ? Any symptoms you have along with the pain.  Follow your treatment plan as told by your health care provider. This may  include: ? Pelvic physical therapy. ? Yoga, meditation, and exercise. ? Biofeedback. This process trains you to manage your body's response (physiological response) through breathing techniques and relaxation methods. You will work with a therapist while machines are used to monitor your physical symptoms. ? Acupuncture. This is a type of treatment that involves stimulating specific points on your body by inserting thin needles through your skin to treat pain.  Keep all follow-up visits as told by your health care provider. This is important.   Contact a health care provider if:  Medicine does not help your pain.  Your pain comes back.  You have new symptoms.  You have a fever or chills.  You are constipated.  You have blood in your urine or stool.  You feel weak or light-headed. Get help right away if:  You have sudden severe pain.  Your pain steadily gets worse.  You have severe pain along with fever, nausea, vomiting, or excessive sweating. Summary  Pelvic pain is pain in your lower abdomen, below your belly button and between your hips. There are many possible causes of pelvic pain. Sometimes, the cause is not known.  Take over-the-counter and prescription medicines only as told by your health care provider.   Contact a health care provider if you have new or worsening symptoms.  Get help right away if you have severe pain along with fever, nausea, vomiting, or excessive sweating.  Keep all follow-up visits as told by  your health care provider. This is important. This information is not intended to replace advice given to you by your health care provider. Make sure you discuss any questions you have with your health care provider. Document Revised: 11/27/2017 Document Reviewed: 11/27/2017 Elsevier Patient Education  2021 [employer].

## 2020-08-29 NOTE — Progress Notes (Signed)
Urological Symptom Review  Patient is experiencing the following symptoms: Testicular pain   Review of Systems  Gastrointestinal (upper)  : Negative for upper GI symptoms  Gastrointestinal (lower) : Negative for lower GI symptoms  Constitutional : Negative for symptoms  Skin: Negative for skin symptoms  Eyes: Negative for eye symptoms  Ear/Nose/Throat : Negative for Ear/Nose/Throat symptoms  Hematologic/Lymphatic: Negative for Hematologic/Lymphatic symptoms  Cardiovascular : Negative for cardiovascular symptoms  Respiratory : Negative for respiratory symptoms  Endocrine: Negative for endocrine symptoms  Musculoskeletal: Negative for musculoskeletal symptoms  Neurological: Negative for neurological symptoms  Psychologic: Negative for psychiatric symptoms

## 2020-08-29 NOTE — Progress Notes (Signed)
[facility] [place]  Chief Complaint: Pelvic/testicle pain  History of Present Illness: Brett [name] is a 60 year old male referred for testicle pain.  He has had pain on and off for over 10 years. He has a h/o left testicular torsion at age 59. He was treated with Rocephin and doxycycline 01/22 for a flare of testicle pain but I did not see any urinalysis or culture. He also noted a "bruise" on the penis and the left side of the penis and it was "collapsed". No h/o ED or use of pde5i. He called here and a stat scrotal ultrasound was ordered 08/24/2020 which showed blood flow to both testicles, normal right testicle at 3.6 x 2.7 cm, left testicle smaller than the right at 3 cm x 2 cm and noted to be more heterogeneous with hypoechogenic areas especially towards the upper pole.  This was thought to be a sequela of prior torsion or trauma.  No discrete testicular mass. He has some back pain.   Prostate cancer screening- . No FH of PCa.   He is a Doctor, hospital.   No complaint of LUTS. No gross hematuria. No dysuria.    Past Medical History:  Diagnosis Date  . Arthritis   . Colitis   . GERD (gastroesophageal reflux disease)   . Hypertension    Past Surgical History:  Procedure Laterality Date  . ESOPHAGEAL BANDING N/A 11/23/2016   Procedure: ESOPHAGEAL BANDING;  Surgeon: Malissa Hippo, MD;  [facility];  Service: Endoscopy;  Laterality: N/A;  . ESOPHAGOGASTRODUODENOSCOPY (EGD) WITH PROPOFOL N/A 09/19/2016   Procedure: ESOPHAGOGASTRODUODENOSCOPY (EGD) WITH PROPOFOL;  Surgeon: Malissa Hippo, MD;  [facility]: [facility];  Service: Endoscopy;  Laterality: N/A;  possible esophageal variceal banding  . ESOPHAGOGASTRODUODENOSCOPY (EGD) WITH PROPOFOL N/A 11/23/2016   Procedure: ESOPHAGOGASTRODUODENOSCOPY (EGD) WITH PROPOFOL;  Surgeon: Malissa Hippo, MD;  Location: [facility];  Service: Endoscopy;  Laterality: N/A;  . KNEE SURGERY Right    torn meniscus  .  SURGERY SCROTAL / TESTICULAR      Home Medications:  (Not in a hospital admission)  Allergies: No Known Allergies  No family history on file. Social History:  reports that he has been smoking cigarettes. He has a 40.00 pack-year smoking history. He has never used smokeless tobacco. He reports previous drug use. Drug: Marijuana. He reports that he does not drink alcohol.  ROS: A complete review of systems was performed.  All systems are negative except for pertinent findings as noted. ROS   Physical Exam:  Vital signs in last 24 hours: @VSRANGES @ General:  Alert and oriented, No acute distress HEENT: Normocephalic, atraumatic Neck: No JVD or lymphadenopathy Cardiovascular: Regular rate and rhythm Lungs: Regular rate and effort Abdomen: Soft, nontender, nondistended, no abdominal masses Back: No CVA tenderness Extremities: No edema Neurologic: Grossly intact GU: circumcised - the "bruise" he was talking about looks like web of scrotal skin pulled up ventrally from prior circumcision. Testicles descended bilaterally and palpably normal (no mass), left testicle small than right, bilateral epididymis palpably normal, scrotum normal DRE: Prostate 30 g, smooth without hard area or nodule   Laboratory Data:  No results found for this or any previous visit (from the past 24 hour(s)). No results found for this or any previous visit (from the past 240 hour(s)). Creatinine: No results for input(s): CREATININE in the last 168 hours.  Impression/Assessment/pain:  Right testicle pain - disc and exam nl. He can take tylenol and NSAIDs. Do  some stretching (he's been cutting a lot of trees). 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   PCa screening - DRE nl. PSA sent.   Brett [name] 08/29/2020, 10:05 AM

## 2020-08-30 LAB — PSA: Prostate Specific Ag, Serum: 0.8 ng/mL (ref 0.0–4.0)
